# Patient Record
Sex: Male | Born: 1980 | Race: Black or African American | Hispanic: No | Marital: Single | Smoking: Current every day smoker
Health system: Southern US, Community
[De-identification: ages and names within clinical notes are randomized; demographics above are authoritative.]

## PROBLEM LIST (undated history)

## (undated) HISTORY — PX: HIP SURGERY: SHX245

## (undated) HISTORY — PX: HAND SURGERY: SHX662

---

## 2016-12-21 ENCOUNTER — Ambulatory Visit (INDEPENDENT_AMBULATORY_CARE_PROVIDER_SITE_OTHER): Payer: Medicaid Other | Admitting: Otolaryngology

## 2016-12-21 DIAGNOSIS — H9201 Otalgia, right ear: Secondary | ICD-10-CM | POA: Diagnosis not present

## 2016-12-21 DIAGNOSIS — H93293 Other abnormal auditory perceptions, bilateral: Secondary | ICD-10-CM | POA: Diagnosis not present

## 2017-02-08 ENCOUNTER — Ambulatory Visit (HOSPITAL_COMMUNITY)
Admission: RE | Admit: 2017-02-08 | Discharge: 2017-02-08 | Disposition: A | Discharge status: Home / Self Care | Payer: Medicaid Other | Source: Ambulatory Visit | Attending: Neurology | Admitting: Neurology

## 2017-02-08 ENCOUNTER — Other Ambulatory Visit: Payer: Self-pay | Admitting: Neurology

## 2017-02-08 DIAGNOSIS — M545 Low back pain: Secondary | ICD-10-CM

## 2017-02-08 DIAGNOSIS — M25551 Pain in right hip: Secondary | ICD-10-CM

## 2017-02-08 DIAGNOSIS — Z9889 Other specified postprocedural states: Secondary | ICD-10-CM | POA: Insufficient documentation

## 2017-12-27 ENCOUNTER — Encounter (HOSPITAL_COMMUNITY): Payer: Self-pay | Admitting: Emergency Medicine

## 2017-12-27 ENCOUNTER — Emergency Department (HOSPITAL_COMMUNITY)
Admission: EM | Admit: 2017-12-27 | Discharge: 2017-12-28 | Disposition: A | Discharge status: Home / Self Care | Payer: BLUE CROSS/BLUE SHIELD | Attending: Emergency Medicine | Admitting: Emergency Medicine

## 2017-12-27 ENCOUNTER — Other Ambulatory Visit: Payer: Self-pay

## 2017-12-27 DIAGNOSIS — F17200 Nicotine dependence, unspecified, uncomplicated: Secondary | ICD-10-CM | POA: Insufficient documentation

## 2017-12-27 DIAGNOSIS — H9202 Otalgia, left ear: Secondary | ICD-10-CM | POA: Diagnosis not present

## 2017-12-27 DIAGNOSIS — K029 Dental caries, unspecified: Secondary | ICD-10-CM | POA: Diagnosis not present

## 2017-12-27 NOTE — ED Triage Notes (Signed)
Pt c/o left ear pain x one week.

## 2017-12-28 MED ORDER — AMOXICILLIN 250 MG PO CAPS
500.0000 mg | ORAL_CAPSULE | Freq: Once | ORAL | Status: AC
  Administered 2017-12-28: 500 mg via ORAL
  Filled 2017-12-28: qty 2

## 2017-12-28 MED ORDER — PROMETHAZINE HCL 12.5 MG PO TABS
12.5000 mg | ORAL_TABLET | Freq: Once | ORAL | Status: AC
  Administered 2017-12-28: 12.5 mg via ORAL
  Filled 2017-12-28: qty 1

## 2017-12-28 MED ORDER — IBUPROFEN 600 MG PO TABS: 600.0000 mg | ORAL_TABLET | Freq: Four times a day (QID) | ORAL | 0 refills | Status: DC

## 2017-12-28 MED ORDER — TRAMADOL HCL 50 MG PO TABS
100.0000 mg | ORAL_TABLET | Freq: Once | ORAL | Status: AC
  Administered 2017-12-28: 100 mg via ORAL
  Filled 2017-12-28: qty 2

## 2017-12-28 MED ORDER — DEXAMETHASONE 4 MG PO TABS: 4.0000 mg | ORAL_TABLET | Freq: Two times a day (BID) | ORAL | 0 refills | Status: DC

## 2017-12-28 MED ORDER — IBUPROFEN 800 MG PO TABS
800.0000 mg | ORAL_TABLET | Freq: Once | ORAL | Status: AC
  Administered 2017-12-28: 800 mg via ORAL
  Filled 2017-12-28: qty 1

## 2017-12-28 MED ORDER — AMOXICILLIN 500 MG PO CAPS: 500.0000 mg | ORAL_CAPSULE | Freq: Three times a day (TID) | ORAL | 0 refills | Status: DC

## 2017-12-28 NOTE — ED Provider Notes (Signed)
[facility] EMERGENCY DEPARTMENT Provider Note   CSN: [id number] Arrival date & time: 12/27/17  2238     History   Chief Complaint Chief Complaint  Patient presents with  . Otalgia    HPI Christian Cook is a 37 y.o. male.  Patient is a 37 year old male who presents to the emergency department with a complaint of left ear pain for a week.  The patient states he has not had anything to injure his ear.  He has not put anything in his ear.  He has not been swimming a lot lately.  Is been no drainage from the left ear.  The patient states that he has has had some toothache recently, now has ear pain, as well as neck and face pain on the left side.  No fever, no chills, no nausea, and no vomiting reported.  No difficulty with swallowing or with breathing.  The history is provided by the patient.  Otalgia  Pertinent negatives include no abdominal pain, no neck pain and no cough.    History reviewed. No pertinent past medical history.  There are no active problems to display for this patient.   Past Surgical History:  Procedure Laterality Date  . HAND SURGERY    . HIP SURGERY          Home Medications    Prior to Admission medications   Not on File    Family History No family history on file.  Social History Social History   Tobacco Use  . Smoking status: Current Every Day Smoker  . Smokeless tobacco: Never Used  Substance Use Topics  . Alcohol use: Not Currently  . Drug use: Not Currently     Allergies   Patient has no allergy information on record.   Review of Systems Review of Systems  Constitutional: Negative for activity change.       All ROS Neg except as noted in HPI  HENT: Positive for dental problem and ear pain. Negative for nosebleeds.   Eyes: Negative for photophobia and discharge.  Respiratory: Negative for cough, shortness of breath and wheezing.   Cardiovascular: Negative for chest pain and palpitations.  Gastrointestinal: Negative for  abdominal pain and blood in stool.  Genitourinary: Negative for dysuria, frequency and hematuria.  Musculoskeletal: Negative for arthralgias, back pain and neck pain.  Skin: Negative.   Neurological: Negative for dizziness, seizures and speech difficulty.  Psychiatric/Behavioral: Negative for confusion and hallucinations.     Physical Exam Updated Vital Signs BP (!) 145/80 (BP Location: Right Arm)   Pulse 78   Temp 98.5 F (36.9 C) (Oral)   Resp 18   Ht 5\' 10"  (1.778 m)   Wt 93 kg   SpO2 100%   BMI 29.41 kg/m   Physical Exam  Constitutional: He is oriented to person, place, and time. He appears well-developed and well-nourished.  Non-toxic appearance.  HENT:  Head: Normocephalic.  Right Ear: Tympanic membrane and external ear normal.  Left Ear: Tympanic membrane and external ear normal.  Mild fluid behind the tympanic membrane on the left.  No bulging of the drum.  No erythema of the tympanic membrane.  The external auditory canal is clear.  There is no pre-or postauricular nodes appreciated.  The patient has a deep cavity of the left lower molar.  There is some swelling of the gum.  The airway is patent.  There is no swelling under the tongue.  Eyes: Pupils are equal, round, and reactive to light. EOM and  lids are normal.  Neck: Normal range of motion. Neck supple. Carotid bruit is not present.  Cardiovascular: Normal rate, regular rhythm, normal heart sounds, intact distal pulses and normal pulses.  Pulmonary/Chest: Breath sounds normal. No respiratory distress.  Abdominal: Soft. Bowel sounds are normal. There is no tenderness. There is no guarding.  Musculoskeletal: Normal range of motion.  Lymphadenopathy:       Head (right side): No submandibular adenopathy present.       Head (left side): No submandibular adenopathy present.    He has no cervical adenopathy.  Neurological: He is alert and oriented to person, place, and time. He has normal strength. No cranial nerve  deficit or sensory deficit.  Skin: Skin is warm and dry.  Psychiatric: He has a normal mood and affect. His speech is normal.  Nursing note and vitals reviewed.    ED Treatments / Results  Labs (all labs ordered are listed, but only abnormal results are displayed) Labs Reviewed - No data to display  EKG None  Radiology No results found.  Procedures Procedures (including critical care time)  Medications Ordered in ED Medications  traMADol (ULTRAM) tablet 100 mg (has no administration in time range)  promethazine (PHENERGAN) tablet 12.5 mg (has no administration in time range)  ibuprofen (ADVIL,MOTRIN) tablet 800 mg (has no administration in time range)  amoxicillin (AMOXIL) capsule 500 mg (has no administration in time range)     Initial Impression / Assessment and Plan / ED Course  I have reviewed the triage vital signs and the nursing notes.  Pertinent labs & imaging results that were available during my care of the patient were reviewed by me and considered in my medical decision making (see chart for details).      Final Clinical Impressions(s) / ED Diagnoses MDM  Vital signs within normal limits.  There is no foreign body in the left ear.  There is mild fluid behind the drum, otherwise no significant findings on exam.  There is noted deep cavity of the lower left molar.  I suspect that this is the cause of the patient's face, neck, and ear pain.  There is no evidence for Ludewig's angina.  There are no other emergent changes appreciated.  The patient will be treated with ibuprofen, Tylenol, and amoxicillin.  I strongly encouraged the patient to see a dentist as soon as possible.   Final diagnoses:  Dental caries  Otalgia of left ear    ED Discharge Orders         Ordered    amoxicillin (AMOXIL) 500 MG capsule  3 times daily     12/28/17 0049    dexamethasone (DECADRON) 4 MG tablet  2 times daily with meals     12/28/17 0049    ibuprofen (ADVIL,MOTRIN) 600  MG tablet  4 times daily     12/28/17 0049           Ivery Quale, PA-C 12/28/17 7829    Devoria Albe, MD 12/28/17 [phone]

## 2017-12-28 NOTE — Discharge Instructions (Addendum)
Your vital signs within normal limits.  I suspect that your ear problem is related to infected dental caries.  Is extremely important that you see a dentist as soon as possible next week.  Please use Amoxil 3 times daily.  Use ibuprofen 600 mg, and Tylenol 1000 mg with breakfast, lunch, dinner, and at bedtime.  Use Decadron 2 times daily with food.
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
## 2020-04-23 ENCOUNTER — Ambulatory Visit: Payer: Commercial Managed Care - PPO | Admitting: Nurse Practitioner

## 2020-06-04 ENCOUNTER — Ambulatory Visit (INDEPENDENT_AMBULATORY_CARE_PROVIDER_SITE_OTHER): Payer: Commercial Managed Care - PPO | Admitting: Nurse Practitioner

## 2020-06-04 ENCOUNTER — Encounter: Payer: Self-pay | Admitting: Nurse Practitioner

## 2020-06-04 DIAGNOSIS — I10 Essential (primary) hypertension: Secondary | ICD-10-CM | POA: Diagnosis not present

## 2020-06-04 MED ORDER — ATENOLOL 50 MG PO TABS: 50.0000 mg | ORAL_TABLET | Freq: Every day | ORAL | 0 refills | Status: DC

## 2020-06-04 NOTE — Progress Notes (Signed)
   Virtual Visit via telephone Note Due to COVID-19 pandemic this visit was conducted virtually. This visit type was conducted due to national recommendations for restrictions regarding the COVID-19 Pandemic (e.g. social distancing, sheltering in place) in an effort to limit this patient's exposure and mitigate transmission in our community. All issues noted in this document were discussed and addressed.  A physical exam was not performed with this format.  I connected with Christian Cook on 06/04/20 at  10 AM by telephone and verified that I am speaking with the correct person using two identifiers. Christian Cook is currently located at home and girlfriend is with patient during visit. The provider, Daryll Drown, NP is located in their office at time of visit.  I discussed the limitations, risks, security and privacy concerns of performing an evaluation and management service by telephone and the availability of in person appointments. I also discussed with the patient that there may be a patient responsible charge related to this service. The patient expressed understanding and agreed to proceed.   History and Present Illness:  HPI  Pt presents for follow up of hypertension. Patient was diagnosed a few years ago The patient is tolerating the medication well without side effects. Compliance with treatment has been good; including taking medication as directed , maintains a healthy diet and regular exercise regimen , and following up as directed. atenolol 50 mg tablet daily  Review of Systems  Constitutional: Negative.   HENT: Negative.   Eyes: Negative.   Respiratory: Negative.   Cardiovascular: Negative.   Skin: Negative.   Neurological: Negative.   All other systems reviewed and are negative.    Observations/Objective: Tele visit  Assessment and Plan: Essential hypertension Hypertension well managed on atenolol 50 mg tablet daily, no changes to current dose, follow up in 3  months.    Follow Up Instructions: Follow up in 3 months   I discussed the assessment and treatment plan with the patient. The patient was provided an opportunity to ask questions and all were answered. The patient agreed with the plan and demonstrated an understanding of the instructions.   The patient was advised to call back or seek an in-person evaluation if the symptoms worsen or if the condition fails to improve as anticipated.  The above assessment and management plan was discussed with the patient. The patient verbalized understanding of and has agreed to the management plan. Patient is aware to call the clinic if symptoms persist or worsen. Patient is aware when to return to the clinic for a follow-up visit. Patient educated on when it is appropriate to go to the emergency department.   Time call ended:  10:11 am  I provided 11 minutes of non-face-to-face time during this encounter.    Daryll Drown, NP

## 2020-06-04 NOTE — Assessment & Plan Note (Signed)
Hypertension well managed on atenolol 50 mg tablet daily, no changes to current dose, follow up in 3 months.

## 2022-02-15 ENCOUNTER — Ambulatory Visit: Payer: Commercial Managed Care - PPO | Admitting: Nurse Practitioner

## 2022-02-17 ENCOUNTER — Ambulatory Visit: Payer: Commercial Managed Care - PPO | Admitting: Nurse Practitioner

## 2022-02-24 ENCOUNTER — Encounter: Payer: Self-pay | Admitting: Nurse Practitioner

## 2022-02-24 ENCOUNTER — Ambulatory Visit: Payer: Commercial Managed Care - PPO | Admitting: Nurse Practitioner

## 2022-02-24 VITALS — Ht 70.0 in

## 2022-02-24 DIAGNOSIS — I10 Essential (primary) hypertension: Secondary | ICD-10-CM

## 2022-02-24 DIAGNOSIS — G8929 Other chronic pain: Secondary | ICD-10-CM | POA: Diagnosis not present

## 2022-02-24 DIAGNOSIS — M25551 Pain in right hip: Secondary | ICD-10-CM | POA: Diagnosis not present

## 2022-02-24 NOTE — Assessment & Plan Note (Signed)
Ongoing right hip pain.  Managed with tramadol 25 mg tablet by mouth daily.

## 2022-02-24 NOTE — Patient Instructions (Signed)
Hip Pain The hip is the joint between the upper legs and the lower pelvis. The bones, cartilage, tendons, and muscles of your hip joint support your body and allow you to move around. Hip pain can range from a minor ache to severe pain in one or both of your hips. The pain may be felt on the inside of the hip joint near the groin, or on the outside near the buttocks and upper thigh. You may also have swelling or stiffness in your hip area. Follow these instructions at home: Managing pain, stiffness, and swelling     If directed, put ice on the painful area. To do this: Put ice in a plastic bag. Place a towel between your skin and the bag. Leave the ice on for 20 minutes, 2-3 times a day. If directed, apply heat to the affected area as often as told by your health care provider. Use the heat source that your health care provider recommends, such as a moist heat pack or a heating pad. Place a towel between your skin and the heat source. Leave the heat on for 20-30 minutes. Remove the heat if your skin turns bright red. This is especially important if you are unable to feel pain, heat, or cold. You may have a greater risk of getting burned. Activity Do exercises as told by your health care provider. Avoid activities that cause pain. General instructions  Take over-the-counter and prescription medicines only as told by your health care provider. Keep a journal of your symptoms. Write down: How often you have hip pain. The location of your pain. What the pain feels like. What makes the pain worse. Sleep with a pillow between your legs on your most comfortable side. Keep all follow-up visits as told by your health care provider. This is important. Contact a health care provider if: You cannot put weight on your leg. Your pain or swelling continues or gets worse after one week. It gets harder to walk. You have a fever. Get help right away if: You fall. You have a sudden increase in pain  and swelling in your hip. Your hip is red or swollen or very tender to touch. Summary Hip pain can range from a minor ache to severe pain in one or both of your hips. The pain may be felt on the inside of the hip joint near the groin, or on the outside near the buttocks and upper thigh. Avoid activities that cause pain. Write down how often you have hip pain, the location of the pain, what makes it worse, and what it feels like. This information is not intended to replace advice given to you by your health care provider. Make sure you discuss any questions you have with your health care provider. Document Revised: 09/23/2018 Document Reviewed: 09/23/2018 Elsevier Patient Education  [place]. Hypertension, Adult Hypertension is another name for high blood pressure. High blood pressure forces your heart to work harder to pump blood. This can cause problems over time. There are two numbers in a blood pressure reading. There is a top number (systolic) over a bottom number (diastolic). It is best to have a blood pressure that is below 120/80. What are the causes? The cause of this condition is not known. Some other conditions can lead to high blood pressure. What increases the risk? Some lifestyle factors can make you more likely to develop high blood pressure: Smoking. Not getting enough exercise or physical activity. Being overweight. Having too much fat,  sugar, calories, or salt (sodium) in your diet. Drinking too much alcohol. Other risk factors include: Having any of these conditions: Heart disease. Diabetes. High cholesterol. Kidney disease. Obstructive sleep apnea. Having a family history of high blood pressure and high cholesterol. Age. The risk increases with age. Stress. What are the signs or symptoms? High blood pressure may not cause symptoms. Very high blood pressure (hypertensive crisis) may cause: Headache. Fast or uneven heartbeats (palpitations). Shortness of  breath. Nosebleed. Vomiting or feeling like you may vomit (nauseous). Changes in how you see. Very bad chest pain. Feeling dizzy. Seizures. How is this treated? This condition is treated by making healthy lifestyle changes, such as: Eating healthy foods. Exercising more. Drinking less alcohol. Your doctor may prescribe medicine if lifestyle changes do not help enough and if: Your top number is above 130. Your bottom number is above 80. Your personal target blood pressure may vary. Follow these instructions at home: Eating and drinking  If told, follow the DASH eating plan. To follow this plan: Fill one half of your plate at each meal with fruits and vegetables. Fill one fourth of your plate at each meal with whole grains. Whole grains include whole-wheat pasta, brown rice, and whole-grain bread. Eat or drink low-fat dairy products, such as skim milk or low-fat yogurt. Fill one fourth of your plate at each meal with low-fat (lean) proteins. Low-fat proteins include fish, chicken without skin, eggs, beans, and tofu. Avoid fatty meat, cured and processed meat, or chicken with skin. Avoid pre-made or processed food. Limit the amount of salt in your diet to less than 1,500 mg each day. Do not drink alcohol if: Your doctor tells you not to drink. You are pregnant, may be pregnant, or are planning to become pregnant. If you drink alcohol: Limit how much you have to: 0-1 drink a day for women. 0-2 drinks a day for men. Know how much alcohol is in your drink. In the [place] one drink equals one 12 oz bottle of beer (355 mL), one 5 oz glass of wine (148 mL), or one 1 oz glass of hard liquor (44 mL). Lifestyle  Work with your doctor to stay at a healthy weight or to lose weight. Ask your doctor what the best weight is for you. Get at least 30 minutes of exercise that causes your heart to beat faster (aerobic exercise) most days of the week. This may include walking, swimming, or  biking. Get at least 30 minutes of exercise that strengthens your muscles (resistance exercise) at least 3 days a week. This may include lifting weights or doing Pilates. Do not smoke or use any products that contain nicotine or tobacco. If you need help quitting, ask your doctor. Check your blood pressure at home as told by your doctor. Keep all follow-up visits. Medicines Take over-the-counter and prescription medicines only as told by your doctor. Follow directions carefully. Do not skip doses of blood pressure medicine. The medicine does not work as well if you skip doses. Skipping doses also puts you at risk for problems. Ask your doctor about side effects or reactions to medicines that you should watch for. Contact a doctor if: You think you are having a reaction to the medicine you are taking. You have headaches that keep coming back. You feel dizzy. You have swelling in your ankles. You have trouble with your vision. Get help right away if: You get a very bad headache. You start to feel mixed up (confused). You feel  weak or numb. You feel faint. You have very bad pain in your: Chest. Belly (abdomen). You vomit more than once. You have trouble breathing. These symptoms may be an emergency. Get help right away. Call 911. Do not wait to see if the symptoms will go away. Do not drive yourself to the hospital. Summary Hypertension is another name for high blood pressure. High blood pressure forces your heart to work harder to pump blood. For most people, a normal blood pressure is less than 120/80. Making healthy choices can help lower blood pressure. If your blood pressure does not get lower with healthy choices, you may need to take medicine. This information is not intended to replace advice given to you by your health care provider. Make sure you discuss any questions you have with your health care provider. Document Revised: 02/24/2021 Document Reviewed: 02/24/2021 Elsevier  Patient Education  2023 [employer].

## 2022-02-24 NOTE — Assessment & Plan Note (Addendum)
Hypertension well-controlled on current medication atenolol 50 mg tablet by mouth daily.  No changes necessary.  Follow-up 6 months with labs.

## 2022-02-24 NOTE — Progress Notes (Signed)
Acute Office Visit  Subjective:     Patient ID: Christian Cook, male    DOB: Oct 05, 1980, 41 y.o.   MRN: [id number]  Chief Complaint  Patient presents with   Pain Management    referral    HPI Patient is in today for Pain  He reports chronic right hip pain. was not an injury that may have caused the pain. The pain started a few years ago and is staying constant. The pain does not radiate . The pain is described as aching, soreness, stiffness, and throbbing, is 7/10 in intensity, occurring constantly. Symptoms are worse in the: morning, mid-day, afternoon  Aggravating factors: walking Relieving factors: none.  He has tried prescription pain relievers with moderate relief.    Pt presents for follow up of hypertension. Patient was diagnosed in 06/04/2020. The patient is tolerating the medication well without side effects. Compliance with treatment has been good; including taking atenolol 50 mg tablet by mouth daily.  As directed , maintains a healthy diet and regular exercise regimen , and following up as directed.     Review of Systems  Constitutional: Negative.  Negative for chills and fever.  HENT: Negative.    Respiratory: Negative.    Cardiovascular: Negative.   Gastrointestinal: Negative.   Genitourinary: Negative.   Musculoskeletal:  Positive for joint pain.  Skin:  Negative for itching and rash.  All other systems reviewed and are negative.       Objective:    Ht 5\' 10"  (1.778 m)   BMI 29.41 kg/m  BP Readings from Last 3 Encounters:  12/27/17 (!) 145/80   Wt Readings from Last 3 Encounters:  12/27/17 205 lb (93 kg)      Physical Exam Vitals and nursing note reviewed.  HENT:     Head: Normocephalic and atraumatic.     Right Ear: External ear normal.     Left Ear: External ear normal.     Nose: Nose normal.     Mouth/Throat:     Mouth: Mucous membranes are moist.     Pharynx: Oropharynx is clear.  Eyes:     Conjunctiva/sclera: Conjunctivae normal.   Cardiovascular:     Rate and Rhythm: Regular rhythm.     Pulses: Normal pulses.     Heart sounds: Normal heart sounds.  Pulmonary:     Effort: Pulmonary effort is normal.  Abdominal:     General: Bowel sounds are normal.  Musculoskeletal:     Right hip: Tenderness present. Decreased range of motion.  Skin:    General: Skin is warm.     Findings: No erythema or rash.  Neurological:     General: No focal deficit present.     Mental Status: He is alert and oriented to person, place, and time.  Psychiatric:        Behavior: Behavior normal.     No results found for any visits on 02/24/22.      Assessment & Plan:   Problem List Items Addressed This Visit       Cardiovascular and Mediastinum   Essential hypertension    Hypertension well-controlled on current medication atenolol 50 mg tablet by mouth daily.  No changes necessary.  Follow-up 6 months with labs.        Other   Chronic right hip pain - Primary    Ongoing right hip pain.  Managed with tramadol 25 mg tablet by mouth daily.      Relevant Orders   Ambulatory referral to  Pain Clinic    No orders of the defined types were placed in this encounter.   Return in about 6 months (around 08/26/2022) for Hypertension.  Daryll Drown, NP

## 2022-03-30 ENCOUNTER — Encounter: Payer: Self-pay | Admitting: Physical Medicine and Rehabilitation

## 2022-06-04 NOTE — Progress Notes (Deleted)
Christian Cook is a 41 y.o. year old male  who  has no past medical history on file.   They are presenting to PM&R clinic as a new patient for pain management evaluation. They were referred by your PCP Coralyn Mark, NP  for treatment of right hip pain.   Per her last note: "He reports chronic right hip pain. was not an injury that may have caused the pain. The pain started a few years ago and is staying constant. The pain does not radiate . The pain is described as aching, soreness, stiffness, and throbbing, is 7/10 in intensity, occurring constantly. Symptoms are worse in the: morning, mid-day, afternoon  Aggravating factors: walking Relieving factors: none.  He has tried prescription pain relievers with moderate relief. "  Source: *** Inciting incident: none Duration of pain: constant, *** Description of pain: *** Severity: On average *** /10. At worst *** /10. At best *** /10. Exacerbating factors: *** Remitting factors: *** Red flag symptoms: Patient denies saddle anesthesia, loss of bowel or bladder continence, new weakness, new numbness/tingling, or pain waking up at nighttime.  Medications tried: Topical medications ( *** effect) : *** Nsaids ( *** effect): Tried ibuprofen 600 mg QID in 2019.  Tylenol  ( *** effect): *** Opiates  ( *** effect): On tramadol 50 mg Q4H PRN (#240 tabs monthly) since at least 2021. Filled last 05/12/22 by Christella Hartigan, MD; has 2 scripts left per PDMP.  Gabapentin / Lyrica  ( *** effect): *** TCAs  ( *** effect): *** SNRIs  ( *** effect): *** Other  ( *** effect): ***  Other treatments: PT/OT  ( *** effect): *** Accupuncture/chiropractor/massage  ( *** effect): *** TENs unit ( *** effect): *** Injections ( *** effect): *** Surgery ( *** effect) : *** Other  ( *** effect): ***  Goals for pain control: ***

## 2022-06-05 ENCOUNTER — Encounter
Payer: Commercial Managed Care - PPO | Attending: Physical Medicine and Rehabilitation | Admitting: Physical Medicine and Rehabilitation

## 2022-08-11 ENCOUNTER — Telehealth: Payer: Self-pay | Admitting: Nurse Practitioner
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
## 2022-08-14 NOTE — Telephone Encounter (Signed)
Tried calling patient to see if he could come in one day this week to re-establish care with Donzetta Kohut, since she has had a few cancellations, so that he can discuss his medication issue with her so that she can determin whether or not to fill until he is seen at the pain management clinic. Patient did not answer and was unable to leave a message.

## 2022-08-14 NOTE — Telephone Encounter (Signed)
He will have to be seen in person by a provider and they will have to make the decision on whether or not to refill or not but he does need to be seen in person to discuss this by somebody.  We cannot promise that they will fill it or that they will not, they will have to look extensively at his history and make a decision from there with whoever he sees.

## 2022-08-23 ENCOUNTER — Encounter
Payer: Commercial Managed Care - PPO | Attending: Physical Medicine and Rehabilitation | Admitting: Physical Medicine and Rehabilitation

## 2022-09-01 ENCOUNTER — Ambulatory Visit: Payer: Commercial Managed Care - PPO | Admitting: Family Medicine

## 2022-09-01 ENCOUNTER — Encounter: Payer: Self-pay | Admitting: Family Medicine

## 2022-09-01 VITALS — BP 118/80 | HR 73 | Temp 98.3°F | Ht 70.0 in | Wt 220.0 lb

## 2022-09-01 DIAGNOSIS — F339 Major depressive disorder, recurrent, unspecified: Secondary | ICD-10-CM | POA: Insufficient documentation

## 2022-09-01 DIAGNOSIS — I10 Essential (primary) hypertension: Secondary | ICD-10-CM

## 2022-09-01 DIAGNOSIS — F410 Panic disorder [episodic paroxysmal anxiety] without agoraphobia: Secondary | ICD-10-CM | POA: Diagnosis not present

## 2022-09-01 DIAGNOSIS — G8928 Other chronic postprocedural pain: Secondary | ICD-10-CM

## 2022-09-01 DIAGNOSIS — Z136 Encounter for screening for cardiovascular disorders: Secondary | ICD-10-CM

## 2022-09-01 DIAGNOSIS — E559 Vitamin D deficiency, unspecified: Secondary | ICD-10-CM

## 2022-09-01 MED ORDER — ATENOLOL 50 MG PO TABS: 50.0000 mg | ORAL_TABLET | Freq: Every day | ORAL | 0 refills | Status: DC

## 2022-09-01 MED ORDER — HYDROXYZINE HCL 10 MG PO TABS: ORAL_TABLET | ORAL | 0 refills | Status: DC

## 2022-09-01 MED ORDER — SERTRALINE HCL 25 MG PO TABS: 25.0000 mg | ORAL_TABLET | Freq: Every day | ORAL | 0 refills | Status: DC

## 2022-09-01 MED ORDER — TRAMADOL HCL 50 MG PO TABS: ORAL_TABLET | ORAL | 0 refills | Status: DC

## 2022-09-01 NOTE — Progress Notes (Signed)
New Patient Office Visit  Subjective   Patient ID: Christian Cook, male    DOB: [birth year]  Age: 42 y.o. MRN: [id number]  CC:  Chief Complaint  Patient presents with   Establish Care    Hypertension f/u Med refill Pain mgmt referral, Dr. Gerilyn Pilgrim retired.   HPI Christian Cook presents to establish care Pain in right hip from an accident.  States that because of the right hip pain has to compensate and it causes pain in his left knee.  States that he was previously treated in [place] with hydrocodone and he did not like it because it made him sleepy and itchy. He has since been on Tramadol (2006). Previously treated by Dr. Gerilyn Pilgrim who retired.  Last filled tramadol February 20th.     Depression and Axneity  Takes on Atarax daily for sleep.  Has panic attacks often while he is trying to go to sleep. Atarax helps. Reports that he has a fear of death. Denies SI. Has protective factors in sons. He has not ever taken an SSRI that he knows of.    Outpatient Encounter Medications as of 09/01/2022  Medication Sig   atenolol (TENORMIN) 50 MG tablet Take 1 tablet (50 mg total) by mouth daily.   hydrOXYzine (ATARAX) 10 MG tablet    ibuprofen (ADVIL,MOTRIN) 600 MG tablet Take 1 tablet (600 mg total) by mouth 4 (four) times daily.   traMADol (ULTRAM) 50 MG tablet    No facility-administered encounter medications on file as of 09/01/2022.    History reviewed. No pertinent past medical history.  Past Surgical History:  Procedure Laterality Date   HAND SURGERY     HIP SURGERY      History reviewed. No pertinent family history.  Social History   Socioeconomic History   Marital status: Single    Spouse name: Not on file   Number of children: Not on file   Years of education: Not on file   Highest education level: Not on file  Occupational History   Not on file  Tobacco Use   Smoking status: Every Day   Smokeless tobacco: Never  Substance and Sexual Activity   Alcohol use:  Not Currently   Drug use: Not Currently   Sexual activity: Not on file  Other Topics Concern   Not on file  Social History Narrative   Not on file   Social Determinants of Health   Financial Resource Strain: Not on file  Food Insecurity: Not on file  Transportation Needs: Not on file  Physical Activity: Not on file  Stress: Not on file  Social Connections: Not on file  Intimate Partner Violence: Not on file    ROS As per HPI  Objective   BP 118/80   Pulse 73   Temp 98.3 F (36.8 C)   Ht 5\' 10"  (1.778 m)   Wt 220 lb (99.8 kg)   SpO2 99%   BMI 31.57 kg/m   Physical Exam Constitutional:      General: He is not in acute distress.    Appearance: Normal appearance. He is not ill-appearing, toxic-appearing or diaphoretic.  Cardiovascular:     Rate and Rhythm: Normal rate.     Pulses: Normal pulses.     Heart sounds: Normal heart sounds. No murmur heard.    No gallop.  Pulmonary:     Effort: Pulmonary effort is normal. No respiratory distress.     Breath sounds: Normal breath sounds. No stridor. No wheezing, rhonchi or rales.  Skin:    General: Skin is warm.     Capillary Refill: Capillary refill takes less than 2 seconds.  Neurological:     General: No focal deficit present.     Mental Status: He is alert and oriented to person, place, and time. Mental status is at baseline.     Motor: No weakness.  Psychiatric:        Mood and Affect: Mood normal.        Behavior: Behavior normal.        Thought Content: Thought content normal.        Judgment: Judgment normal.    Assessment & Plan:  1. Other chronic postprocedural pain Referral placed as below for patient to follow up with pain management. Explained to patient that this is a one time bridge of pain medication until he can be seen by the pain clinic.  - Ambulatory referral to Pain Clinic - traMADol (ULTRAM) 50 MG tablet; Take one - two  tablets every 6 hours as needed for pain  Dispense: 240 tablet; Refill:  0  2. Primary hypertension Patient well controlled on medication. Discussed with patient that his current regimen is that the most up to date. Instructed for patient to check his BP at home daily in the am with feet on the floor after sitting for 5 minutes. Will have patient take medication once daily for one month and re-evaluate. Patient was previously taking atenolol twice daily. Labs as below. Will communicate results to patient once available.  - atenolol (TENORMIN) 50 MG tablet; Take 1 tablet (50 mg total) by mouth daily.  Dispense: 60 tablet; Refill: 0 - CBC with Differential/Platelet - CMP14+EGFR  3. Anxiety attack Medication refilled as below.  Will start a daily medication as below. BBW discussed with patient with instructions to follow up in one month.  Denies SI/HI Labs as below. Will communicate results to patient once available.  - hydrOXYzine (ATARAX) 10 MG tablet; Take as needed for anxiety  Dispense: 30 tablet; Refill: 0 - sertraline (ZOLOFT) 25 MG tablet; Take 1 tablet (25 mg total) by mouth daily.  Dispense: 30 tablet; Refill: 0 - TSH - Vitamin B12 - VITAMIN D 25 Hydroxy (Vit-D Deficiency, Fractures)  4. Depression, recurrent Pt screened positive for depression today with PHQ-9. Pt offered nonpharmacologic therapy. Pt declined initiating treatment at this time. Safety contract established today with patient in clinic. Denies intent to harm themselves or others.  Will start pharm therapy as below.  - sertraline (ZOLOFT) 25 MG tablet; Take 1 tablet (25 mg total) by mouth daily.  Dispense: 30 tablet; Refill: 0  5. Encounter for screening for cardiovascular disorders Labs as below. Will communicate results to patient once available.  Fasting since 2 am, labs collected ~2 pm  - Lipid panel  The above assessment and management plan was discussed with the patient. The patient verbalized understanding of and has agreed to the management plan using shared-decision making.  Patient is aware to call the clinic if they develop any new symptoms or if symptoms fail to improve or worsen. Patient is aware when to return to the clinic for a follow-up visit. Patient educated on when it is appropriate to go to the emergency department.   Neale Burly, DNP-FNP [facility] [address] [place], [place] [address] [phone]

## 2022-09-02 LAB — CMP14+EGFR
ALT: 16 IU/L (ref 0–44)
AST: 18 IU/L (ref 0–40)
Albumin/Globulin Ratio: 1.7 (ref 1.2–2.2)
Albumin: 4.6 g/dL (ref 4.1–5.1)
Alkaline Phosphatase: 113 IU/L (ref 44–121)
BUN/Creatinine Ratio: 13 (ref 9–20)
BUN: 12 mg/dL (ref 6–24)
Bilirubin Total: 0.4 mg/dL (ref 0.0–1.2)
CO2: 23 mmol/L (ref 20–29)
Calcium: 9.7 mg/dL (ref 8.7–10.2)
Chloride: 99 mmol/L (ref 96–106)
Creatinine, Ser: 0.94 mg/dL (ref 0.76–1.27)
Globulin, Total: 2.7 g/dL (ref 1.5–4.5)
Glucose: 93 mg/dL (ref 70–99)
Potassium: 4.1 mmol/L (ref 3.5–5.2)
Sodium: 137 mmol/L (ref 134–144)
Total Protein: 7.3 g/dL (ref 6.0–8.5)
eGFR: 104 mL/min/{1.73_m2} (ref >59)

## 2022-09-02 LAB — CBC WITH DIFFERENTIAL/PLATELET
Basophils Absolute: 0.1 10*3/uL (ref 0.0–0.2)
Basos: 0 %
EOS (ABSOLUTE): 0.2 10*3/uL (ref 0.0–0.4)
Eos: 2 %
Hematocrit: 41.6 % (ref 37.5–51.0)
Hemoglobin: 13.5 g/dL (ref 13.0–17.7)
Immature Grans (Abs): 0 10*3/uL (ref 0.0–0.1)
Immature Granulocytes: 0 %
Lymphocytes Absolute: 4.7 10*3/uL — ABNORMAL HIGH (ref 0.7–3.1)
Lymphs: 42 %
MCH: 26.4 pg — ABNORMAL LOW (ref 26.6–33.0)
MCHC: 32.5 g/dL (ref 31.5–35.7)
MCV: 81 fL (ref 79–97)
Monocytes Absolute: 0.8 10*3/uL (ref 0.1–0.9)
Monocytes: 7 %
Neutrophils Absolute: 5.6 10*3/uL (ref 1.4–7.0)
Neutrophils: 49 %
Platelets: 338 10*3/uL (ref 150–450)
RBC: 5.11 x10E6/uL (ref 4.14–5.80)
RDW: 14.8 % (ref 11.6–15.4)
WBC: 11.4 10*3/uL — ABNORMAL HIGH (ref 3.4–10.8)

## 2022-09-02 LAB — LIPID PANEL
Chol/HDL Ratio: 6.2 ratio — ABNORMAL HIGH (ref 0.0–5.0)
Cholesterol, Total: 222 mg/dL — ABNORMAL HIGH (ref 100–199)
HDL: 36 mg/dL — ABNORMAL LOW (ref >39)
LDL Chol Calc (NIH): 139 mg/dL — ABNORMAL HIGH (ref 0–99)
Triglycerides: 261 mg/dL — ABNORMAL HIGH (ref 0–149)
VLDL Cholesterol Cal: 47 mg/dL — ABNORMAL HIGH (ref 5–40)

## 2022-09-02 LAB — VITAMIN B12: Vitamin B-12: 939 pg/mL (ref 232–1245)

## 2022-09-02 LAB — VITAMIN D 25 HYDROXY (VIT D DEFICIENCY, FRACTURES): Vit D, 25-Hydroxy: 12.5 ng/mL — ABNORMAL LOW (ref 30.0–100.0)

## 2022-09-02 LAB — TSH: TSH: 1.44 u[IU]/mL (ref 0.450–4.500)

## 2022-09-04 MED ORDER — VITAMIN D (ERGOCALCIFEROL) 1.25 MG (50000 UNIT) PO CAPS: 50000.0000 [IU] | ORAL_CAPSULE | ORAL | 0 refills | Status: AC

## 2022-09-04 NOTE — Addendum Note (Signed)
Addended by: Neale Burly on: 09/04/2022 02:28 PM   Modules accepted: Orders

## 2022-09-26 ENCOUNTER — Telehealth: Payer: Self-pay | Admitting: Family Medicine

## 2022-09-26 NOTE — Telephone Encounter (Signed)
Patient started taking sertraline (ZOLOFT) 25 MG tablet on 09/01/22 and said that he has been having crazy thoughts since. Stated that he was taking hydrOXYzine (ATARAX) 10 MG before and did not feel this way. Please call back and advise.

## 2022-09-26 NOTE — Telephone Encounter (Signed)
Patient aware and verbalizes understanding.  States he is not having any suicidal thoughts.  He is anxiety is just worse.  Will follow up next week.

## 2022-10-03 ENCOUNTER — Ambulatory Visit: Payer: Commercial Managed Care - PPO | Admitting: Family Medicine

## 2022-10-03 ENCOUNTER — Encounter: Payer: Self-pay | Admitting: Family Medicine

## 2022-10-03 VITALS — BP 117/72 | HR 57 | Temp 98.0°F | Ht 70.0 in | Wt 213.0 lb

## 2022-10-03 DIAGNOSIS — F339 Major depressive disorder, recurrent, unspecified: Secondary | ICD-10-CM

## 2022-10-03 DIAGNOSIS — F418 Other specified anxiety disorders: Secondary | ICD-10-CM

## 2022-10-03 DIAGNOSIS — F410 Panic disorder [episodic paroxysmal anxiety] without agoraphobia: Secondary | ICD-10-CM | POA: Diagnosis not present

## 2022-10-03 DIAGNOSIS — E785 Hyperlipidemia, unspecified: Secondary | ICD-10-CM | POA: Insufficient documentation

## 2022-10-03 DIAGNOSIS — E782 Mixed hyperlipidemia: Secondary | ICD-10-CM

## 2022-10-03 DIAGNOSIS — E559 Vitamin D deficiency, unspecified: Secondary | ICD-10-CM

## 2022-10-03 DIAGNOSIS — B351 Tinea unguium: Secondary | ICD-10-CM

## 2022-10-03 MED ORDER — ATORVASTATIN CALCIUM 20 MG PO TABS: 20.0000 mg | ORAL_TABLET | Freq: Every day | ORAL | 0 refills | Status: DC

## 2022-10-03 MED ORDER — TERBINAFINE HCL 250 MG PO TABS: 250.0000 mg | ORAL_TABLET | Freq: Every day | ORAL | 2 refills | Status: AC

## 2022-10-03 MED ORDER — HYDROXYZINE HCL 10 MG PO TABS: ORAL_TABLET | ORAL | 0 refills | Status: DC

## 2022-10-03 NOTE — Progress Notes (Signed)
Acute Office Visit  Subjective:  Patient ID: Christian Cook, male    DOB: 26-Aug-1980, 42 y.o.   MRN: [id number]  Chief Complaint  Patient presents with   Medical Management of Chronic Issues   HPI Patient is in today for follow up zoloft start. Stopped taking zoloft one week ago after trying it for 2.5 weeks. States that it made him very moody. Reports that he was "having the worst thoughts" he thinks about dying by car crash, drowning, heart attack, etc. States that he feels that best when using the atarax before bed. He helps him sleep through the night. Being out of atarax, he has not slept well. Interrupted sleep and struggles to fall asleep.   HLD  Reports that he used to be on Lipitor when he was in [facility]. Tolerated well.   Toe nail/foot itching States that he dropped a palate on his feet 3 years ago and recently dropped another one on his foot at work. Reports that his foot itches and he has been trying cream to help with the itching.   ROS As per HPI  Objective:  BP 117/72   Pulse (!) 57   Temp 98 F (36.7 C)   Ht 5\' 10"  (1.778 m)   Wt 213 lb (96.6 kg)   SpO2 98%   BMI 30.56 kg/m   Physical Exam Constitutional:      General: He is not in acute distress.    Appearance: Normal appearance. He is not ill-appearing, toxic-appearing or diaphoretic.  Cardiovascular:     Rate and Rhythm: Normal rate.     Pulses: Normal pulses.     Heart sounds: Normal heart sounds. No murmur heard.    No gallop.  Pulmonary:     Effort: Pulmonary effort is normal. No respiratory distress.     Breath sounds: Normal breath sounds. No stridor. No wheezing, rhonchi or rales.  Feet:     Right foot:     Skin integrity: Dry skin present.     Toenail Condition: Right toenails are abnormally thick and long. Fungal disease present.    Comments: Ecchymosis with purpling, black, under right great toenail  Skin:    General: Skin is warm.     Capillary Refill: Capillary refill takes less than 2  seconds.  Neurological:     General: No focal deficit present.     Mental Status: He is alert and oriented to person, place, and time. Mental status is at baseline.     Motor: No weakness.  Psychiatric:        Mood and Affect: Mood normal.        Behavior: Behavior normal.        Thought Content: Thought content normal.        Judgment: Judgment normal.    Assessment & Plan:  1. Anxiety associated with depression 2. Depression, recurrent (HCC) 3. Anxiety attack Will refill as below. Denies SI. Will continue to assess need for daily antidepressant/antianxiety.  - hydrOXYzine (ATARAX) 10 MG tablet; Take as needed for anxiety.  Dispense: 90 tablet; Refill: 0  4. Mixed hyperlipidemia Will start Lipitor as below. Patient states that he was previously on lipitor when he was living in [place]. States that he would be more compliant with Lipitor than with supplements such as fish oil and red yeast rice.  - atorvastatin (LIPITOR) 20 MG tablet; Take 1 tablet (20 mg total) by mouth daily.  Dispense: 90 tablet; Refill: 0  5. Vitamin D deficiency Has  not yet picked up supplementation. Patient to notify provider if no longer at pharmacy for pick up.   6. Onychomycosis Will start terbinafine as below. Discussed with patient monitoring LFTs. Previous CMP within normal limits. Will repeat labs at follow up. - terbinafine (LAMISIL) 250 MG tablet; Take 1 tablet (250 mg total) by mouth daily for 84 doses.  Dispense: 30 tablet; Refill: 2 - CMP14+EGFR; Future  The above assessment and management plan was discussed with the patient. The patient verbalized understanding of and has agreed to the management plan using shared-decision making. Patient is aware to call the clinic if they develop any new symptoms or if symptoms fail to improve or worsen. Patient is aware when to return to the clinic for a follow-up visit. Patient educated on when it is appropriate to go to the emergency department.   Return in  about 8 weeks (around 11/28/2022) for Chronic Condition Follow up.  Neale Burly, DNP-FNP [facility] [address] [place], [place] [address] [phone]

## 2022-10-04 LAB — LAB REPORT - SCANNED: EGFR: 115

## 2022-11-16 ENCOUNTER — Ambulatory Visit: Payer: Commercial Managed Care - PPO | Admitting: Family Medicine

## 2022-11-27 ENCOUNTER — Other Ambulatory Visit: Payer: Self-pay | Admitting: Family Medicine

## 2022-11-27 DIAGNOSIS — E782 Mixed hyperlipidemia: Secondary | ICD-10-CM

## 2023-05-04 ENCOUNTER — Encounter: Payer: Self-pay | Admitting: Family Medicine

## 2023-05-04 ENCOUNTER — Ambulatory Visit: Payer: Commercial Managed Care - PPO | Admitting: Family Medicine

## 2023-05-04 VITALS — BP 138/84 | Temp 98.2°F | Ht 70.0 in | Wt 213.0 lb

## 2023-05-04 DIAGNOSIS — G4726 Circadian rhythm sleep disorder, shift work type: Secondary | ICD-10-CM | POA: Diagnosis not present

## 2023-05-04 DIAGNOSIS — F339 Major depressive disorder, recurrent, unspecified: Secondary | ICD-10-CM | POA: Diagnosis not present

## 2023-05-04 DIAGNOSIS — I10 Essential (primary) hypertension: Secondary | ICD-10-CM

## 2023-05-04 DIAGNOSIS — F418 Other specified anxiety disorders: Secondary | ICD-10-CM | POA: Insufficient documentation

## 2023-05-04 DIAGNOSIS — F410 Panic disorder [episodic paroxysmal anxiety] without agoraphobia: Secondary | ICD-10-CM

## 2023-05-04 DIAGNOSIS — E559 Vitamin D deficiency, unspecified: Secondary | ICD-10-CM

## 2023-05-04 DIAGNOSIS — E782 Mixed hyperlipidemia: Secondary | ICD-10-CM

## 2023-05-04 DIAGNOSIS — Z8042 Family history of malignant neoplasm of prostate: Secondary | ICD-10-CM

## 2023-05-04 LAB — LIPID PANEL

## 2023-05-04 MED ORDER — ATORVASTATIN CALCIUM 20 MG PO TABS: 20.0000 mg | ORAL_TABLET | Freq: Every day | ORAL | 0 refills | Status: DC

## 2023-05-04 MED ORDER — TRAZODONE HCL 50 MG PO TABS: 50.0000 mg | ORAL_TABLET | Freq: Every evening | ORAL | 0 refills | Status: DC | PRN

## 2023-05-04 MED ORDER — ATENOLOL 50 MG PO TABS: 50.0000 mg | ORAL_TABLET | Freq: Every day | ORAL | 1 refills | Status: DC

## 2023-05-04 MED ORDER — HYDROXYZINE PAMOATE 25 MG PO CAPS: 25.0000 mg | ORAL_CAPSULE | Freq: Every day | ORAL | 0 refills | Status: DC | PRN

## 2023-05-04 NOTE — Progress Notes (Signed)
Subjective:  Patient ID: Christian Cook, male    DOB: 04-14-1981, 42 y.o.   MRN: [id number]  Patient Care Team: Arrie Senate, FNP as PCP - General (Family Medicine)   Chief Complaint:  Medical Management of Chronic Issues   HPI: Christian Cook is a 42 y.o. male presenting on 05/04/2023 for Medical Management of Chronic Issues  Hypertension Has BP monitor at home Yes BP at home average 130/84 - states that he has been out of his medication for about a month. Reports that he takes it daily in the mornings, as he is about to fall asleep because that is when he is anxious and is having symptoms of anxiety such as palpitations and racing heart.  ROS Denies , fatigue, peripheral edema, changes to vision, chest pain, headaches, sweats, SOB, PND, orthopnea Meds atenolol  CAD risks hypertension  Anxiety  States that he is still having trouble sleeping. 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 He tries to use melatonin. States that he has racing thoughts and anxiety. He is waking up several times a week with "crazy" thoughts and takes about 20-30 minutes to fall back asleep. Reports that he continues to have some palpiations. He typically takes two hydroxyzine capsules to help him sleep. He wonders if it is no longer helping him as he has been taking it for a long time. Denies SI. Does not wish to start counseling. He is still having about one panic attack a month while at work. States that during the most recent one he was able to calm down by walking outside into the cold air.   Lipid/Cholesterol, Follow-up  Last lipid panel Other pertinent labs  Lab Results  Component Value Date   CHOL 222 (H) 09/01/2022   HDL 36 (L) 09/01/2022   LDLCALC 139 (H) 09/01/2022   TRIG 261 (H) 09/01/2022   CHOLHDL 6.2 (H) 09/01/2022   Lab Results  Component Value Date   ALT 16 09/01/2022   AST 18 09/01/2022   PLT 338 09/01/2022   TSH 1.440 09/01/2022     He was last seen for  this 8 months ago.  Management since that visit includes lipitor.  He reports excellent compliance with treatment. He is not having side effects.   Symptoms: No chest pain No chest pressure/discomfort  No dyspnea No lower extremity edema  No numbness or tingling of extremity No orthopnea  Yes palpitations - attributed to anxiety  No paroxysmal nocturnal dyspnea  No speech difficulty No syncope   The 10-year ASCVD risk score (Arnett DK, et al., 2019) is: 12.7%  ---------------------------------------------------------------------------------------------------   Vitamin D Not currently taking supplement.  Not having symptoms. Denies fractures   Of note, his Father was diagnosed with Prostate Cancer at 67/68 and he is concerned and would like to be tested. Denies any symptoms.    Relevant past medical, surgical, family, and social history reviewed and updated as indicated.  Allergies and medications reviewed and updated. Data reviewed: Chart in Epic.   History reviewed. No pertinent past medical history.  Past Surgical History:  Procedure Laterality Date   HAND SURGERY     HIP SURGERY      Social History   Socioeconomic History   Marital status: Single    Spouse name: Not on file   Number of children: Not on file   Years of education: Not on file   Highest education level: Not on file  Occupational History   Not on  file  Tobacco Use   Smoking status: Every Day   Smokeless tobacco: Never  Substance and Sexual Activity   Alcohol use: Not Currently   Drug use: Not Currently   Sexual activity: Not on file  Other Topics Concern   Not on file  Social History Narrative   Not on file   Social Drivers of Health   Financial Resource Strain: Not on file  Food Insecurity: Not on file  Transportation Needs: Not on file  Physical Activity: Not on file  Stress: Not on file  Social Connections: Not on file  Intimate Partner Violence: Not on file    Outpatient  Encounter Medications as of 05/04/2023  Medication Sig   atenolol (TENORMIN) 50 MG tablet Take 1 tablet (50 mg total) by mouth daily.   atorvastatin (LIPITOR) 20 MG tablet TAKE ONE (1) TABLET BY MOUTH EVERY DAY   hydrOXYzine (ATARAX) 10 MG tablet Take as needed for anxiety.   ibuprofen (ADVIL,MOTRIN) 600 MG tablet Take 1 tablet (600 mg total) by mouth 4 (four) times daily.   [DISCONTINUED] traMADol (ULTRAM) 50 MG tablet Take one - two  tablets every 6 hours as needed for pain (Patient not taking: Reported on 05/04/2023)   No facility-administered encounter medications on file as of 05/04/2023.    No Known Allergies  Review of Systems As per HPI  Objective:  BP 138/84   Temp 98.2 F (36.8 C)   Ht 5\' 10"  (1.778 m)   Wt 213 lb (96.6 kg)   SpO2 99%   BMI 30.56 kg/m    Wt Readings from Last 3 Encounters:  05/04/23 213 lb (96.6 kg)  10/03/22 213 lb (96.6 kg)  09/01/22 220 lb (99.8 kg)   Physical Exam Constitutional:      General: He is awake. He is not in acute distress.    Appearance: Normal appearance. He is well-developed and well-groomed. He is not ill-appearing, toxic-appearing or diaphoretic.  Cardiovascular:     Rate and Rhythm: Normal rate and regular rhythm.     Pulses: Normal pulses.          Radial pulses are 2+ on the right side and 2+ on the left side.       Posterior tibial pulses are 2+ on the right side and 2+ on the left side.     Heart sounds: Normal heart sounds. No murmur heard.    No gallop.  Pulmonary:     Effort: Pulmonary effort is normal. No respiratory distress.     Breath sounds: Normal breath sounds. No stridor. No wheezing, rhonchi or rales.  Musculoskeletal:     Cervical back: Full passive range of motion without pain and neck supple.     Right lower leg: No edema.     Left lower leg: No edema.  Skin:    General: Skin is warm.     Capillary Refill: Capillary refill takes less than 2 seconds.  Neurological:     General: No focal deficit  present.     Mental Status: He is alert, oriented to person, place, and time and easily aroused. Mental status is at baseline.     GCS: GCS eye subscore is 4. GCS verbal subscore is 5. GCS motor subscore is 6.     Motor: No weakness.  Psychiatric:        Attention and Perception: Attention and perception normal.        Mood and Affect: Mood and affect normal.  Speech: Speech normal.        Behavior: Behavior normal. Behavior is cooperative.        Thought Content: Thought content normal. Thought content does not include homicidal or suicidal ideation. Thought content does not include homicidal or suicidal plan.        Cognition and Memory: Cognition and memory normal.        Judgment: Judgment normal.     Results for orders placed or performed in visit on 10/18/22  Lab report - scanned   Collection Time: 10/04/22 12:00 AM  Result Value Ref Range   EGFR 115.0        05/04/2023    8:36 AM 10/03/2022    8:09 AM 09/01/2022    1:52 PM 06/04/2020   10:14 AM  Depression screen PHQ 2/9  Decreased Interest 2 1 2  0  Down, Depressed, Hopeless 2 1 0 0  PHQ - 2 Score 4 2 2  0  Altered sleeping  1 2   Tired, decreased energy 2 1 3    Change in appetite 1 2 1    Feeling bad or failure about yourself  0 0 0   Trouble concentrating 0 0 0   Moving slowly or fidgety/restless 0 0 3   Suicidal thoughts 0 2 0   PHQ-9 Score  8 11   Difficult doing work/chores Not difficult at all Somewhat difficult Very difficult        05/04/2023    8:36 AM 10/03/2022    8:10 AM 09/01/2022    1:53 PM  GAD 7 : Generalized Anxiety Score  Nervous, Anxious, on Edge 2 1 3   Control/stop worrying 1 2 0  Worry too much - different things 1 2 0  Trouble relaxing 1 1 3   Restless 1 1 2   Easily annoyed or irritable 2 2 2   Afraid - awful might happen 1 2 0  Total GAD 7 Score 9 11 10   Anxiety Difficulty Not difficult at all Somewhat difficult Very difficult    Pertinent labs & imaging results that were  available during my care of the patient were reviewed by me and considered in my medical decision making.  Assessment & Plan:  Meron was seen today for medical management of chronic issues.  Diagnoses and all orders for this visit:  1. Primary hypertension (Primary) Not at goal. Requested that patient monitor BP at home and bring in measurements. Discussed with patient changing regimen as he is not on first line therapy for hypertension. Shared decision making utilized to stay on current medication as it is treating his symptoms of anxiety as well by helping with his heart racing and palpitations. Labs as below. Will communicate results to patient once available. Will await results to determine next steps.  - atenolol (TENORMIN) 50 MG tablet; Take 1 tablet (50 mg total) by mouth daily.  Dispense: 90 tablet; Refill: 1 - CBC with Differential/Platelet - CMP14+EGFR  2. Sleep disorder, circadian, shift work type Will start medication as below. Discussed with patient not using both hydroxyzine and trazodone for sleep. He will use hydroxyzine sparingly as needed for panic attacks while at work. He will continue to use sleep hygiene practices.  - traZODone (DESYREL) 50 MG tablet; Take 1 tablet (50 mg total) by mouth at bedtime as needed for sleep.  Dispense: 90 tablet; Refill: 0  3. Anxiety associated with depression Discussed with patient not using both hydroxyzine and trazodone for sleep. He will use hydroxyzine sparingly as needed for panic  attacks while at work. He will continue to use sleep hygiene practices. Depression very well controlled. Denies SI. Declines counseling. Declines daily antianxiety pharmacologic aid.  - hydrOXYzine (VISTARIL) 25 MG capsule; Take 1 capsule (25 mg total) by mouth daily as needed for anxiety.  Dispense: 60 capsule; Refill: 0  4. Depression, recurrent (HCC) As above.   5. Anxiety attack As above.  - hydrOXYzine (VISTARIL) 25 MG capsule; Take 1 capsule (25 mg  total) by mouth daily as needed for anxiety.  Dispense: 60 capsule; Refill: 0  6. Mixed hyperlipidemia Refill provided. Labs as below. Will communicate results to patient once available. Will await results to determine next steps. Fasting  - atorvastatin (LIPITOR) 20 MG tablet; Take 1 tablet (20 mg total) by mouth daily.  Dispense: 90 tablet; Refill: 0 - Lipid panel  7. Family history of prostate cancer Labs as below. Will communicate results to patient once available. Will await results to determine next steps.  - PSA  8. Vitamin D deficiency Labs as below. Will communicate results to patient once available. Will await results to determine next steps.  - VITAMIN D 25 Hydroxy (Vit-D Deficiency, Fractures)  Continue all other maintenance medications.  Follow up plan: Return in about 2 months (around 07/05/2023) for Chronic Condition Follow up.  Continue healthy lifestyle choices, including diet (rich in fruits, vegetables, and lean proteins, and low in salt and simple carbohydrates) and exercise (at least 30 minutes of moderate physical activity daily).  Written and verbal instructions provided   The above assessment and management plan was discussed with the patient. The patient verbalized understanding of and has agreed to the management plan. Patient is aware to call the clinic if they develop any new symptoms or if symptoms persist or worsen. Patient is aware when to return to the clinic for a follow-up visit. Patient educated on when it is appropriate to go to the emergency department.   Neale Burly, DNP-FNP [facility] [address] [place], [place] [address] [phone]

## 2023-05-05 LAB — COMPREHENSIVE METABOLIC PANEL
ALT: 15 IU/L (ref 0–44)
AST: 18 IU/L (ref 0–40)
Albumin: 4.1 g/dL (ref 4.1–5.1)
Alkaline Phosphatase: 96 IU/L (ref 44–121)
BUN/Creatinine Ratio: 14 (ref 9–20)
BUN: 12 mg/dL (ref 6–24)
Bilirubin Total: 0.3 mg/dL (ref 0.0–1.2)
CO2: 25 mmol/L (ref 20–29)
Calcium: 9.3 mg/dL (ref 8.7–10.2)
Chloride: 102 mmol/L (ref 96–106)
Creatinine, Ser: 0.83 mg/dL (ref 0.76–1.27)
Globulin, Total: 2.7 g/dL (ref 1.5–4.5)
Glucose: 86 mg/dL (ref 70–99)
Potassium: 4.8 mmol/L (ref 3.5–5.2)
Sodium: 141 mmol/L (ref 134–144)
Total Protein: 6.8 g/dL (ref 6.0–8.5)
eGFR: 112 mL/min/{1.73_m2} (ref >59)

## 2023-05-05 LAB — CBC WITH DIFFERENTIAL/PLATELET
Basophils Absolute: 0 10*3/uL (ref 0.0–0.2)
Basos: 0 %
EOS (ABSOLUTE): 0.2 10*3/uL (ref 0.0–0.4)
Eos: 2 %
Hematocrit: 40.7 % (ref 37.5–51.0)
Hemoglobin: 13.1 g/dL (ref 13.0–17.7)
Immature Grans (Abs): 0 10*3/uL (ref 0.0–0.1)
Immature Granulocytes: 0 %
Lymphocytes Absolute: 3.9 10*3/uL — ABNORMAL HIGH (ref 0.7–3.1)
Lymphs: 56 %
MCH: 27.1 pg (ref 26.6–33.0)
MCHC: 32.2 g/dL (ref 31.5–35.7)
MCV: 84 fL (ref 79–97)
Monocytes Absolute: 0.7 10*3/uL (ref 0.1–0.9)
Monocytes: 10 %
Neutrophils Absolute: 2.3 10*3/uL (ref 1.4–7.0)
Neutrophils: 32 %
Platelets: 309 10*3/uL (ref 150–450)
RBC: 4.83 x10E6/uL (ref 4.14–5.80)
RDW: 14.5 % (ref 11.6–15.4)
WBC: 7.1 10*3/uL (ref 3.4–10.8)

## 2023-05-05 LAB — LIPID PANEL
Cholesterol, Total: 160 mg/dL (ref 100–199)
HDL: 37 mg/dL — ABNORMAL LOW (ref >39)
LDL CALC COMMENT:: 4.3 ratio (ref 0.0–5.0)
LDL Chol Calc (NIH): 104 mg/dL — ABNORMAL HIGH (ref 0–99)
Triglycerides: 103 mg/dL (ref 0–149)
VLDL Cholesterol Cal: 19 mg/dL (ref 5–40)

## 2023-05-05 LAB — PSA: Prostate Specific Ag, Serum: 0.3 ng/mL (ref 0.0–4.0)

## 2023-05-05 LAB — VITAMIN D 25 HYDROXY (VIT D DEFICIENCY, FRACTURES): Vit D, 25-Hydroxy: 32.1 ng/mL (ref 30.0–100.0)

## 2023-05-08 ENCOUNTER — Telehealth: Payer: Self-pay | Admitting: Family Medicine

## 2023-05-08 NOTE — Telephone Encounter (Signed)
Patient would like a call back regarding his labs results he would like a call in the morning around 8

## 2023-05-08 NOTE — Progress Notes (Signed)
Cholesterol improved! Recommend remaining on lipitor as he has an elevated risk score. The 10-year ASCVD risk score (Arnett DK, et al., 2019) is: 11.5% Slightly elevated lymphocytes, not concerning at this time. All other labs normal!

## 2023-05-09 NOTE — Telephone Encounter (Signed)
See result notes. 

## 2023-05-22 ENCOUNTER — Encounter: Payer: Self-pay | Admitting: *Deleted

## 2023-07-06 ENCOUNTER — Ambulatory Visit: Payer: Commercial Managed Care - PPO | Admitting: Family Medicine

## 2023-07-27 ENCOUNTER — Ambulatory Visit (INDEPENDENT_AMBULATORY_CARE_PROVIDER_SITE_OTHER): Payer: Commercial Managed Care - PPO | Admitting: Family Medicine

## 2023-07-27 ENCOUNTER — Encounter: Payer: Self-pay | Admitting: Family Medicine

## 2023-07-27 VITALS — BP 124/82 | HR 56 | Temp 98.0°F | Ht 70.0 in | Wt 218.0 lb

## 2023-07-27 DIAGNOSIS — I10 Essential (primary) hypertension: Secondary | ICD-10-CM

## 2023-07-27 DIAGNOSIS — Z720 Tobacco use: Secondary | ICD-10-CM | POA: Insufficient documentation

## 2023-07-27 DIAGNOSIS — E559 Vitamin D deficiency, unspecified: Secondary | ICD-10-CM

## 2023-07-27 DIAGNOSIS — M25551 Pain in right hip: Secondary | ICD-10-CM

## 2023-07-27 DIAGNOSIS — F418 Other specified anxiety disorders: Secondary | ICD-10-CM

## 2023-07-27 DIAGNOSIS — E782 Mixed hyperlipidemia: Secondary | ICD-10-CM

## 2023-07-27 DIAGNOSIS — G8929 Other chronic pain: Secondary | ICD-10-CM

## 2023-07-27 DIAGNOSIS — F339 Major depressive disorder, recurrent, unspecified: Secondary | ICD-10-CM

## 2023-07-27 MED ORDER — NICOTINE POLACRILEX 4 MG MT GUM: 4.0000 mg | CHEWING_GUM | OROMUCOSAL | 0 refills | Status: DC | PRN

## 2023-07-27 MED ORDER — PREDNISONE 20 MG PO TABS: 40.0000 mg | ORAL_TABLET | Freq: Every day | ORAL | 0 refills | Status: AC

## 2023-07-27 NOTE — Progress Notes (Signed)
 Subjective:  Patient ID: Christian Cook, male    DOB: 1980-10-18, 43 y.o.   MRN: [id number]  Patient Care Team: Arrie Senate, FNP as PCP - General (Family Medicine)   Chief Complaint:  Medical Management of Chronic Issues (3 month follow up/Right hip pain)  HPI: Christian Cook is a 43 y.o. male presenting on 07/27/2023 for Medical Management of Chronic Issues (3 month follow up/Right hip pain)  HPI 1. Primary hypertension Has BP monitor at home Yes BP at home average 120/80s  ROS Denies  fatigue, peripheral edema, changes to vision, chest pain, headaches, palpitations, sweats, SOB, PND, orthopnea Meds atenolol denies side effects  CAD risks hypertension, hypercholesterolemia/hyperlipidemia  2. Anxiety associated with depression/3. Depression, recurrent (HCC) Taking trazodone. States that he is sleeping well. Asleep within 20 minutes of taking. States that it is really helping with his thoughts before bed. States that anxiety and depression are doing well. Denies SI. States that he is interested in counseling.   4. Mixed hyperlipidemia Lipid/Cholesterol, Follow-up  Last lipid panel Other pertinent labs  Lab Results  Component Value Date   CHOL 160 05/04/2023   HDL 37 (L) 05/04/2023   LDLCALC 104 (H) 05/04/2023   TRIG 103 05/04/2023   CHOLHDL 4.3 05/04/2023   Lab Results  Component Value Date   ALT 15 05/04/2023   AST 18 05/04/2023   PLT 309 05/04/2023   TSH 1.440 09/01/2022     He was last seen for this 3 months ago.  Management since that visit includes Lipitor   He reports fair compliance with treatment. Taking in am before going to sleep. Works Engineer, water.  He is not having side effects.   Symptoms: No chest pain No chest pressure/discomfort  No dyspnea No lower extremity edema  No numbness or tingling of extremity No orthopnea  No palpitations No paroxysmal nocturnal dyspnea  No speech difficulty No syncope   Current diet: well  balanced Current exercise:  labor job   The 10-year ASCVD risk score (Arnett DK, et al., 2019) is: 9.5%  ---------------------------------------------------------------------------------------------------   5. Vitamin D deficiency Vitamin D deficiency, follow-up  Lab Results  Component Value Date   VD25OH 32.1 05/04/2023   VD25OH 12.5 (L) 09/01/2022   CALCIUM 9.3 05/04/2023   CALCIUM 9.7 09/01/2022   Wt Readings from Last 3 Encounters:  07/27/23 218 lb (98.9 kg)  05/04/23 213 lb (96.6 kg)  10/03/22 213 lb (96.6 kg)   He was last seen for vitamin D deficiency 2 months ago.  Management since that visit includes - not currently taking  He reports poor compliance with treatment. He is not having side effects.   Symptoms: No change in energy level No numbness or tingling  No bone pain No unexplained fracture   --------------------------------------------------------------------------------------------------- 6. Chronic right hip pain States that he has aggravation from work. States that after work he has to lay in bed. Cannot do the activities that he would like to due to pain. States that he is doing a lot of walking and heavy lifting. States that they are short-staffed and he is having to work more. He is interested in following with PT and ortho. He is no longer following with pain management as he is did not like the way the medications made him feel.   7. Tobacco Use  Smokes 1/2 PPD  Started at 43 years old  States that he is interested in quitting. Has tried in the past.    Relevant past medical,  surgical, family, and social history reviewed and updated as indicated.  Allergies and medications reviewed and updated. Data reviewed: Chart in Epic.   History reviewed. No pertinent past medical history.  Past Surgical History:  Procedure Laterality Date   HAND SURGERY     HIP SURGERY      Social History   Socioeconomic History   Marital status: Single    Spouse  name: Not on file   Number of children: Not on file   Years of education: Not on file   Highest education level: GED or equivalent  Occupational History   Not on file  Tobacco Use   Smoking status: Every Day   Smokeless tobacco: Never  Substance and Sexual Activity   Alcohol use: Not Currently   Drug use: Not Currently   Sexual activity: Not on file  Other Topics Concern   Not on file  Social History Narrative   Not on file   Social Drivers of Health   Financial Resource Strain: High Risk (07/26/2023)   Overall Financial Resource Strain (CARDIA)    Difficulty of Paying Living Expenses: Hard  Food Insecurity: No Food Insecurity (07/26/2023)   Hunger Vital Sign    Worried About Running Out of Food in the Last Year: Never true    Ran Out of Food in the Last Year: Never true  Transportation Needs: No Transportation Needs (07/26/2023)   PRAPARE - Administrator, Civil Service (Medical): No    Lack of Transportation (Non-Medical): No  Physical Activity: Sufficiently Active (07/26/2023)   Exercise Vital Sign    Days of Exercise per Week: 5 days    Minutes of Exercise per Session: 150+ min  Stress: Stress Concern Present (07/26/2023)   [employer] of Occupational Health - Occupational Stress Questionnaire    Feeling of Stress : Very much  Social Connections: Socially Isolated (07/26/2023)   Social Connection and Isolation Panel [NHANES]    Frequency of Communication with Friends and Family: Once a week    Frequency of Social Gatherings with Friends and Family: Once a week    Attends Religious Services: Never    Database administrator or Organizations: No    Attends Banker Meetings: Not on file    Marital Status: Separated  Intimate Partner Violence: Not on file    Outpatient Encounter Medications as of 07/27/2023  Medication Sig   atenolol (TENORMIN) 50 MG tablet Take 1 tablet (50 mg total) by mouth daily.   atorvastatin (LIPITOR) 20 MG tablet Take 1  tablet (20 mg total) by mouth daily.   hydrOXYzine (VISTARIL) 25 MG capsule Take 1 capsule (25 mg total) by mouth daily as needed for anxiety.   ibuprofen (ADVIL,MOTRIN) 600 MG tablet Take 1 tablet (600 mg total) by mouth 4 (four) times daily.   traZODone (DESYREL) 50 MG tablet Take 1 tablet (50 mg total) by mouth at bedtime as needed for sleep.   No facility-administered encounter medications on file as of 07/27/2023.    No Known Allergies  Review of Systems As per HPI  Objective:  BP 124/82   Pulse (!) 56   Temp 98 F (36.7 C)   Ht 5\' 10"  (1.778 m)   Wt 218 lb (98.9 kg)   SpO2 99%   BMI 31.28 kg/m    Wt Readings from Last 3 Encounters:  07/27/23 218 lb (98.9 kg)  05/04/23 213 lb (96.6 kg)  10/03/22 213 lb (96.6 kg)   Physical Exam Constitutional:  General: He is awake. He is not in acute distress.    Appearance: Normal appearance. He is well-developed, well-groomed and overweight. He is not ill-appearing, toxic-appearing or diaphoretic.  Cardiovascular:     Rate and Rhythm: Normal rate and regular rhythm.     Pulses: Normal pulses.          Radial pulses are 2+ on the right side and 2+ on the left side.       Posterior tibial pulses are 2+ on the right side and 2+ on the left side.     Heart sounds: Normal heart sounds. No murmur heard.    No gallop.  Pulmonary:     Effort: Pulmonary effort is normal. No respiratory distress.     Breath sounds: Normal breath sounds. No stridor. No wheezing, rhonchi or rales.  Musculoskeletal:     Cervical back: Full passive range of motion without pain and neck supple.     Right lower leg: No edema.     Left lower leg: No edema.  Skin:    General: Skin is warm.     Capillary Refill: Capillary refill takes less than 2 seconds.  Neurological:     General: No focal deficit present.     Mental Status: He is alert, oriented to person, place, and time and easily aroused. Mental status is at baseline.     GCS: GCS eye subscore is 4.  GCS verbal subscore is 5. GCS motor subscore is 6.     Motor: No weakness.  Psychiatric:        Attention and Perception: Attention and perception normal.        Mood and Affect: Mood and affect normal.        Speech: Speech normal.        Behavior: Behavior normal. Behavior is cooperative.        Thought Content: Thought content normal. Thought content does not include homicidal or suicidal ideation. Thought content does not include homicidal or suicidal plan.        Cognition and Memory: Cognition and memory normal.        Judgment: Judgment normal.     Results for orders placed or performed in visit on 05/04/23  PSA   Collection Time: 05/04/23 10:56 AM  Result Value Ref Range   Prostate Specific Ag, Serum 0.3 0.0 - 4.0 ng/mL  Lipid panel   Collection Time: 05/04/23 10:56 AM  Result Value Ref Range   Cholesterol, Total 160 100 - 199 mg/dL   Triglycerides 829 0 - 149 mg/dL   HDL 37 (L) >56 mg/dL   VLDL Cholesterol Cal 19 5 - 40 mg/dL   LDL Chol Calc (NIH) 213 (H) 0 - 99 mg/dL   Chol/HDL Ratio 4.3 0.0 - 5.0 ratio  VITAMIN D 25 Hydroxy (Vit-D Deficiency, Fractures)   Collection Time: 05/04/23 10:56 AM  Result Value Ref Range   Vit D, 25-Hydroxy 32.1 30.0 - 100.0 ng/mL  CBC with Differential/Platelet   Collection Time: 05/04/23 10:56 AM  Result Value Ref Range   WBC 7.1 3.4 - 10.8 x10E3/uL   RBC 4.83 4.14 - 5.80 x10E6/uL   Hemoglobin 13.1 13.0 - 17.7 g/dL   Hematocrit 08.6 57.8 - 51.0 %   MCV 84 79 - 97 fL   MCH 27.1 26.6 - 33.0 pg   MCHC 32.2 31.5 - 35.7 g/dL   RDW 46.9 62.9 - 52.8 %   Platelets 309 150 - 450 x10E3/uL   Neutrophils 32 Not  Estab. %   Lymphs 56 Not Estab. %   Monocytes 10 Not Estab. %   Eos 2 Not Estab. %   Basos 0 Not Estab. %   Neutrophils Absolute 2.3 1.4 - 7.0 x10E3/uL   Lymphocytes Absolute 3.9 (H) 0.7 - 3.1 x10E3/uL   Monocytes Absolute 0.7 0.1 - 0.9 x10E3/uL   EOS (ABSOLUTE) 0.2 0.0 - 0.4 x10E3/uL   Basophils Absolute 0.0 0.0 - 0.2 x10E3/uL    Immature Granulocytes 0 Not Estab. %   Immature Grans (Abs) 0.0 0.0 - 0.1 x10E3/uL  Comprehensive metabolic panel   Collection Time: 05/04/23 10:56 AM  Result Value Ref Range   Glucose 86 70 - 99 mg/dL   BUN 12 6 - 24 mg/dL   Creatinine, Ser 1.61 0.76 - 1.27 mg/dL   eGFR 096 >04 VW/UJW/1.19   BUN/Creatinine Ratio 14 9 - 20   Sodium 141 134 - 144 mmol/L   Potassium 4.8 3.5 - 5.2 mmol/L   Chloride 102 96 - 106 mmol/L   CO2 25 20 - 29 mmol/L   Calcium 9.3 8.7 - 10.2 mg/dL   Total Protein 6.8 6.0 - 8.5 g/dL   Albumin 4.1 4.1 - 5.1 g/dL   Globulin, Total 2.7 1.5 - 4.5 g/dL   Bilirubin Total 0.3 0.0 - 1.2 mg/dL   Alkaline Phosphatase 96 44 - 121 IU/L   AST 18 0 - 40 IU/L   ALT 15 0 - 44 IU/L       07/27/2023   10:14 AM 05/04/2023    8:36 AM 10/03/2022    8:09 AM 09/01/2022    1:52 PM 06/04/2020   10:14 AM  Depression screen PHQ 2/9  Decreased Interest 3 2 1 2  0  Down, Depressed, Hopeless 1 2 1  0 0  PHQ - 2 Score 4 4 2 2  0  Altered sleeping 1  1 2    Tired, decreased energy 3 2 1 3    Change in appetite 2 1 2 1    Feeling bad or failure about yourself  0 0 0 0   Trouble concentrating 0 0 0 0   Moving slowly or fidgety/restless 0 0 0 3   Suicidal thoughts 0 0 2 0   PHQ-9 Score 10  8 11    Difficult doing work/chores Very difficult Not difficult at all Somewhat difficult Very difficult        07/27/2023   10:15 AM 05/04/2023    8:36 AM 10/03/2022    8:10 AM 09/01/2022    1:53 PM  GAD 7 : Generalized Anxiety Score  Nervous, Anxious, on Edge 3 2 1 3   Control/stop worrying 3 1 2  0  Worry too much - different things 3 1 2  0  Trouble relaxing 3 1 1 3   Restless 2 1 1 2   Easily annoyed or irritable 3 2 2 2   Afraid - awful might happen 1 1 2  0  Total GAD 7 Score 18 9 11 10   Anxiety Difficulty Very difficult Not difficult at all Somewhat difficult Very difficult   Pertinent labs & imaging results that were available during my care of the patient were reviewed by me and considered in  my medical decision making.  Assessment & Plan:  Hoy was seen today for medical management of chronic issues.  Diagnoses and all orders for this visit:  1. Primary hypertension (Primary) Well controlled. Continue current regimen.   2. Anxiety associated with depression Not at goal. He is doing well with current regimen. Is  interested in counseling. Denies SI. Safety contract established. Patient and/or legal guardian verbally consented to [facility] services about presenting concerns and psychiatric consultation as appropriate.  The services will be billed as appropriate for the patient. - Ambulatory referral to Integrated Behavioral Health  3. Depression, recurrent (HCC) As above.  - Ambulatory referral to Integrated Behavioral Health  4. Mixed hyperlipidemia Continue on current regimen. Will repeat with fasting labs at next appt.   5. Vitamin D deficiency Discussed with patient taking OTC supplementation to maintain vitamin D. Will repeat labs at next appt.   6. Chronic right hip pain Will provide medication as below. Provided patient with light duty/work restrictions of not lifting >30 lbs without assistance for 30 days until he can be evaluated by Ortho and PT.  - Ambulatory referral to Orthopedic Surgery - predniSONE (DELTASONE) 20 MG tablet; Take 2 tablets (40 mg total) by mouth daily with breakfast for 5 days.  Dispense: 10 tablet; Refill: 0 - Ambulatory referral to Physical Therapy  7. Tobacco abuse Interested in quitting. Do no wish to start Wellbutrin due to patient anxiety. Will send in nicorette as below to assist with quitting. Can consider chantix in the future.  - nicotine polacrilex (NICORETTE) 4 MG gum; Take 1 each (4 mg total) by mouth as needed for smoking cessation.  Dispense: 100 tablet; Refill: 0  Continue all other maintenance medications.  Follow up plan: Return in about 6 months (around 01/27/2024) for Chronic Condition Follow  up with fasting labs .   Continue healthy lifestyle choices, including diet (rich in fruits, vegetables, and lean proteins, and low in salt and simple carbohydrates) and exercise (at least 30 minutes of moderate physical activity daily).  Written and verbal instructions provided   The above assessment and management plan was discussed with the patient. The patient verbalized understanding of and has agreed to the management plan. Patient is aware to call the clinic if they develop any new symptoms or if symptoms persist or worsen. Patient is aware when to return to the clinic for a follow-up visit. Patient educated on when it is appropriate to go to the emergency department.   Neale Burly, DNP-FNP [facility] [address] [place], [place] [address] [phone]

## 2023-07-27 NOTE — Patient Instructions (Signed)
 Take a daily OTC vitamin D supplement with 1000-2000 IU.

## 2023-07-28 ENCOUNTER — Other Ambulatory Visit: Payer: Self-pay | Admitting: Family Medicine

## 2023-07-28 DIAGNOSIS — I10 Essential (primary) hypertension: Secondary | ICD-10-CM

## 2023-08-14 ENCOUNTER — Other Ambulatory Visit: Payer: Self-pay | Admitting: Family Medicine

## 2023-08-14 DIAGNOSIS — G4726 Circadian rhythm sleep disorder, shift work type: Secondary | ICD-10-CM

## 2023-08-21 ENCOUNTER — Institutional Professional Consult (permissible substitution): Admitting: Professional Counselor

## 2023-08-21 ENCOUNTER — Encounter: Payer: Self-pay | Admitting: Family Medicine

## 2023-08-31 ENCOUNTER — Ambulatory Visit: Admitting: Family Medicine

## 2023-08-31 ENCOUNTER — Encounter: Payer: Self-pay | Admitting: Family Medicine

## 2023-09-04 ENCOUNTER — Telehealth: Payer: Self-pay | Admitting: Family Medicine

## 2023-09-05 ENCOUNTER — Encounter: Payer: Self-pay | Admitting: Family Medicine

## 2023-09-05 ENCOUNTER — Other Ambulatory Visit

## 2023-09-05 ENCOUNTER — Ambulatory Visit: Admitting: Family Medicine

## 2023-09-05 VITALS — BP 114/75 | HR 66 | Temp 98.3°F | Ht 70.0 in | Wt 216.0 lb

## 2023-09-05 DIAGNOSIS — R079 Chest pain, unspecified: Secondary | ICD-10-CM

## 2023-09-05 MED ORDER — PREDNISONE 20 MG PO TABS: 40.0000 mg | ORAL_TABLET | Freq: Every day | ORAL | 0 refills | Status: AC

## 2023-09-05 NOTE — Progress Notes (Signed)
 Subjective:  Patient ID: Christian Cook, male    DOB: 05-20-1981, 43 y.o.   MRN: [id number]  Patient Care Team: Arrie Senate, FNP as PCP - General (Family Medicine)   Chief Complaint:  left chest pain  HPI: Christian Cook is a 43 y.o. male presenting on 09/05/2023 for left chest pain  HPI Patient reports that ~2 weeks ago started with sharp, intermittent chest pain under left rib. Wonders if he pulled a muscle as the pain typically happens while he is picking up heavy boxes at work. States that it started 2 weeks ago after he was rushing at work and picking up more boxes than normal. Pain occurred a few times last night, but prior to that was much more frequent. External pressure does not change the pain. Deep breathing does not change pain. States that turning sideways can induce the pain. He admits that he had the pain sometimes while at home relaxing. He works at [employer] with constant motion. Denies shortness of breath, palpitations, sweating. Does not radiate. Reports that it has gotten better over the past two days.   Relevant past medical, surgical, family, and social history reviewed and updated as indicated.  Allergies and medications reviewed and updated. Data reviewed: Chart in Epic.  History reviewed. No pertinent past medical history.  Past Surgical History:  Procedure Laterality Date   HAND SURGERY     HIP SURGERY      Social History   Socioeconomic History   Marital status: Single    Spouse name: Not on file   Number of children: Not on file   Years of education: Not on file   Highest education level: GED or equivalent  Occupational History   Not on file  Tobacco Use   Smoking status: Every Day   Smokeless tobacco: Never  Substance and Sexual Activity   Alcohol use: Not Currently   Drug use: Not Currently   Sexual activity: Not on file  Other Topics Concern   Not on file  Social History Narrative   Not on file   Social Drivers of Health    Financial Resource Strain: High Risk (07/26/2023)   Overall Financial Resource Strain (CARDIA)    Difficulty of Paying Living Expenses: Hard  Food Insecurity: No Food Insecurity (07/26/2023)   Hunger Vital Sign    Worried About Running Out of Food in the Last Year: Never true    Ran Out of Food in the Last Year: Never true  Transportation Needs: No Transportation Needs (07/26/2023)   PRAPARE - Administrator, Civil Service (Medical): No    Lack of Transportation (Non-Medical): No  Physical Activity: Sufficiently Active (07/26/2023)   Exercise Vital Sign    Days of Exercise per Week: 5 days    Minutes of Exercise per Session: 150+ min  Stress: Stress Concern Present (07/26/2023)   [employer] of Occupational Health - Occupational Stress Questionnaire    Feeling of Stress : Very much  Social Connections: Socially Isolated (07/26/2023)   Social Connection and Isolation Panel [NHANES]    Frequency of Communication with Friends and Family: Once a week    Frequency of Social Gatherings with Friends and Family: Once a week    Attends Religious Services: Never    Database administrator or Organizations: No    Attends Banker Meetings: Not on file    Marital Status: Separated  Intimate Partner Violence: Not At Risk (09/05/2023)   Humiliation, Afraid, Rape,  and Kick questionnaire    Fear of Current or Ex-Partner: No    Emotionally Abused: No    Physically Abused: No    Sexually Abused: No    Outpatient Encounter Medications as of 09/05/2023  Medication Sig   atenolol (TENORMIN) 50 MG tablet TAKE ONE (1) TABLET BY MOUTH EVERY DAY   atorvastatin (LIPITOR) 20 MG tablet Take 1 tablet (20 mg total) by mouth daily.   hydrOXYzine (VISTARIL) 25 MG capsule Take 1 capsule (25 mg total) by mouth daily as needed for anxiety.   ibuprofen (ADVIL,MOTRIN) 600 MG tablet Take 1 tablet (600 mg total) by mouth 4 (four) times daily.   nicotine polacrilex (NICORETTE) 4 MG gum Take 1 each  (4 mg total) by mouth as needed for smoking cessation.   traZODone (DESYREL) 50 MG tablet TAKE 1 TABLET BY MOUTH AT BEDTIME AS NEEDED FOR SLEEP   No facility-administered encounter medications on file as of 09/05/2023.    No Known Allergies  Review of Systems As per HPI  Objective:  BP 114/75   Pulse 66   Temp 98.3 F (36.8 C)   Ht 5\' 10"  (1.778 m)   Wt 216 lb (98 kg)   SpO2 98%   BMI 30.99 kg/m    Wt Readings from Last 3 Encounters:  09/05/23 216 lb (98 kg)  07/27/23 218 lb (98.9 kg)  05/04/23 213 lb (96.6 kg)    Physical Exam Constitutional:      General: He is awake. He is not in acute distress.    Appearance: Normal appearance. He is well-developed and well-groomed. He is not ill-appearing, toxic-appearing or diaphoretic.  Cardiovascular:     Rate and Rhythm: Normal rate and regular rhythm.     Pulses: Normal pulses.          Radial pulses are 2+ on the right side and 2+ on the left side.       Posterior tibial pulses are 2+ on the right side and 2+ on the left side.     Heart sounds: Normal heart sounds. No murmur heard.    No gallop.  Pulmonary:     Effort: Pulmonary effort is normal. No respiratory distress.     Breath sounds: Normal breath sounds. No stridor. No wheezing, rhonchi or rales.  Chest:     Chest wall: No mass, lacerations, deformity, swelling, tenderness, crepitus or edema.  Breasts:    Breasts are symmetrical.     Comments: Nontender to palpation  Musculoskeletal:     Cervical back: Full passive range of motion without pain and neck supple.     Right lower leg: No edema.     Left lower leg: No edema.  Skin:    General: Skin is warm.     Capillary Refill: Capillary refill takes less than 2 seconds.  Neurological:     General: No focal deficit present.     Mental Status: He is alert, oriented to person, place, and time and easily aroused. Mental status is at baseline.     GCS: GCS eye subscore is 4. GCS verbal subscore is 5. GCS motor subscore  is 6.     Motor: No weakness.  Psychiatric:        Attention and Perception: Attention and perception normal.        Mood and Affect: Mood and affect normal.        Speech: Speech normal.        Behavior: Behavior normal. Behavior is cooperative.  Thought Content: Thought content normal. Thought content does not include homicidal or suicidal ideation. Thought content does not include homicidal or suicidal plan.        Cognition and Memory: Cognition and memory normal.        Judgment: Judgment normal.     Results for orders placed or performed in visit on 05/04/23  PSA   Collection Time: 05/04/23 10:56 AM  Result Value Ref Range   Prostate Specific Ag, Serum 0.3 0.0 - 4.0 ng/mL  Lipid panel   Collection Time: 05/04/23 10:56 AM  Result Value Ref Range   Cholesterol, Total 160 100 - 199 mg/dL   Triglycerides 161 0 - 149 mg/dL   HDL 37 (L) >09 mg/dL   VLDL Cholesterol Cal 19 5 - 40 mg/dL   LDL Chol Calc (NIH) 604 (H) 0 - 99 mg/dL   Chol/HDL Ratio 4.3 0.0 - 5.0 ratio  VITAMIN D 25 Hydroxy (Vit-D Deficiency, Fractures)   Collection Time: 05/04/23 10:56 AM  Result Value Ref Range   Vit D, 25-Hydroxy 32.1 30.0 - 100.0 ng/mL  CBC with Differential/Platelet   Collection Time: 05/04/23 10:56 AM  Result Value Ref Range   WBC 7.1 3.4 - 10.8 x10E3/uL   RBC 4.83 4.14 - 5.80 x10E6/uL   Hemoglobin 13.1 13.0 - 17.7 g/dL   Hematocrit 54.0 98.1 - 51.0 %   MCV 84 79 - 97 fL   MCH 27.1 26.6 - 33.0 pg   MCHC 32.2 31.5 - 35.7 g/dL   RDW 19.1 47.8 - 29.5 %   Platelets 309 150 - 450 x10E3/uL   Neutrophils 32 Not Estab. %   Lymphs 56 Not Estab. %   Monocytes 10 Not Estab. %   Eos 2 Not Estab. %   Basos 0 Not Estab. %   Neutrophils Absolute 2.3 1.4 - 7.0 x10E3/uL   Lymphocytes Absolute 3.9 (H) 0.7 - 3.1 x10E3/uL   Monocytes Absolute 0.7 0.1 - 0.9 x10E3/uL   EOS (ABSOLUTE) 0.2 0.0 - 0.4 x10E3/uL   Basophils Absolute 0.0 0.0 - 0.2 x10E3/uL   Immature Granulocytes 0 Not Estab. %    Immature Grans (Abs) 0.0 0.0 - 0.1 x10E3/uL  Comprehensive metabolic panel   Collection Time: 05/04/23 10:56 AM  Result Value Ref Range   Glucose 86 70 - 99 mg/dL   BUN 12 6 - 24 mg/dL   Creatinine, Ser 6.21 0.76 - 1.27 mg/dL   eGFR 308 >65 HQ/ION/6.29   BUN/Creatinine Ratio 14 9 - 20   Sodium 141 134 - 144 mmol/L   Potassium 4.8 3.5 - 5.2 mmol/L   Chloride 102 96 - 106 mmol/L   CO2 25 20 - 29 mmol/L   Calcium 9.3 8.7 - 10.2 mg/dL   Total Protein 6.8 6.0 - 8.5 g/dL   Albumin 4.1 4.1 - 5.1 g/dL   Globulin, Total 2.7 1.5 - 4.5 g/dL   Bilirubin Total 0.3 0.0 - 1.2 mg/dL   Alkaline Phosphatase 96 44 - 121 IU/L   AST 18 0 - 40 IU/L   ALT 15 0 - 44 IU/L       09/05/2023    8:26 AM 07/27/2023   10:14 AM 05/04/2023    8:36 AM 10/03/2022    8:09 AM 09/01/2022    1:52 PM  Depression screen PHQ 2/9  Decreased Interest 0 3 2 1 2   Down, Depressed, Hopeless 0 1 2 1  0  PHQ - 2 Score 0 4 4 2 2   Altered sleeping 0  1  1 2   Tired, decreased energy 0 3 2 1 3   Change in appetite 0 2 1 2 1   Feeling bad or failure about yourself  0 0 0 0 0  Trouble concentrating 0 0 0 0 0  Moving slowly or fidgety/restless 0 0 0 0 3  Suicidal thoughts 0 0 0 2 0  PHQ-9 Score 0 10  8 11   Difficult doing work/chores  Very difficult Not difficult at all Somewhat difficult Very difficult       09/05/2023    8:26 AM 07/27/2023   10:15 AM 05/04/2023    8:36 AM 10/03/2022    8:10 AM  GAD 7 : Generalized Anxiety Score  Nervous, Anxious, on Edge 2 3 2 1   Control/stop worrying 1 3 1 2   Worry too much - different things 1 3 1 2   Trouble relaxing 0 3 1 1   Restless 0 2 1 1   Easily annoyed or irritable 1 3 2 2   Afraid - awful might happen 0 1 1 2   Total GAD 7 Score 5 18 9 11   Anxiety Difficulty  Very difficult Not difficult at all Somewhat difficult    Pertinent labs & imaging results that were available during my care of the patient were reviewed by me and considered in my medical decision making.  Assessment &  Plan:  Christian Cook was seen today for left chest pain.  Diagnoses and all orders for this visit:  Left-sided chest pain Suspect Costochondritis, therefore will treat with prednisone as below. Not actively in pain, so EKG not completed in office. Will complete zio for further evaluation. Discussed red flag symptoms with patient and when to follow up.  -     predniSONE (DELTASONE) 20 MG tablet; Take 2 tablets (40 mg total) by mouth daily with breakfast for 5 days. -     LONG TERM MONITOR (3-14 DAYS); Future  Continue all other maintenance medications.  Follow up plan: Return if symptoms worsen or fail to improve.   Continue healthy lifestyle choices, including diet (rich in fruits, vegetables, and lean proteins, and low in salt and simple carbohydrates) and exercise (at least 30 minutes of moderate physical activity daily).  Written and verbal instructions provided   The above assessment and management plan was discussed with the patient. The patient verbalized understanding of and has agreed to the management plan. Patient is aware to call the clinic if they develop any new symptoms or if symptoms persist or worsen. Patient is aware when to return to the clinic for a follow-up visit. Patient educated on when it is appropriate to go to the emergency department.   Jacqualyn Mates, DNP-FNP [facility] [address] [place], [place] [address] [phone]

## 2023-09-10 ENCOUNTER — Ambulatory Visit (INDEPENDENT_AMBULATORY_CARE_PROVIDER_SITE_OTHER): Admitting: Professional Counselor

## 2023-09-10 DIAGNOSIS — F411 Generalized anxiety disorder: Secondary | ICD-10-CM
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
## 2023-09-19 ENCOUNTER — Other Ambulatory Visit: Payer: Self-pay | Admitting: Family Medicine

## 2023-09-19 ENCOUNTER — Telehealth (INDEPENDENT_AMBULATORY_CARE_PROVIDER_SITE_OTHER): Payer: Self-pay | Admitting: Professional Counselor

## 2023-09-19 DIAGNOSIS — F4310 Post-traumatic stress disorder, unspecified: Secondary | ICD-10-CM | POA: Diagnosis not present

## 2023-09-19 DIAGNOSIS — E782 Mixed hyperlipidemia: Secondary | ICD-10-CM

## 2023-09-19 MED ORDER — ATORVASTATIN CALCIUM 20 MG PO TABS: 20.0000 mg | ORAL_TABLET | Freq: Every day | ORAL | 0 refills | Status: DC
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
## 2023-09-19 NOTE — Addendum Note (Signed)
 Addended by: Alfonzo Ill on: 09/19/2023 01:40 PM   Modules accepted: Orders

## 2023-09-20 ENCOUNTER — Encounter: Payer: Self-pay | Admitting: *Deleted

## 2023-09-21 ENCOUNTER — Telehealth: Payer: Self-pay | Admitting: Family Medicine

## 2023-09-21 NOTE — Telephone Encounter (Signed)
 Based on recommendations from notes with Polly Brink and psychiatry, recommended patient start zoloft . I can send that in if patient would like to start.

## 2023-09-24 ENCOUNTER — Encounter: Admitting: Professional Counselor

## 2023-09-24 NOTE — Telephone Encounter (Signed)
 Called patient, no answer, no voicemail set up.

## 2023-09-27 ENCOUNTER — Ambulatory Visit: Admitting: Family Medicine

## 2023-09-27 ENCOUNTER — Encounter: Payer: Self-pay | Admitting: Family Medicine

## 2023-09-27 NOTE — Progress Notes (Signed)
 Continue atenolol . Report persistent or worsening symptoms. Avoid caffeine and substance use.

## 2023-10-02 ENCOUNTER — Ambulatory Visit: Payer: Self-pay | Admitting: *Deleted

## 2023-10-02 NOTE — Telephone Encounter (Signed)
 Attempted to contact - NA

## 2023-10-03 NOTE — BH Specialist Note (Signed)
 Service was provided on 09/19/2023 but note was dated 09/21/2023 in error.

## 2023-10-04 NOTE — Telephone Encounter (Signed)
 Attempted to contact, NA, NVM.  Mychart message sent.  Call is closed.

## 2023-11-29 ENCOUNTER — Other Ambulatory Visit: Payer: Self-pay | Admitting: *Deleted

## 2023-11-29 DIAGNOSIS — G4726 Circadian rhythm sleep disorder, shift work type: Secondary | ICD-10-CM

## 2023-11-30 MED ORDER — TRAZODONE HCL 50 MG PO TABS: 50.0000 mg | ORAL_TABLET | Freq: Every evening | ORAL | 0 refills | Status: DC | PRN

## 2023-12-28 ENCOUNTER — Other Ambulatory Visit: Payer: Self-pay | Admitting: *Deleted

## 2023-12-28 DIAGNOSIS — E782 Mixed hyperlipidemia: Secondary | ICD-10-CM

## 2023-12-28 MED ORDER — ATORVASTATIN CALCIUM 20 MG PO TABS: 20.0000 mg | ORAL_TABLET | Freq: Every day | ORAL | 0 refills | Status: DC

## 2024-01-29 NOTE — Progress Notes (Unsigned)
 Established Patient Office Visit  Subjective  Patient ID: Christian [name], male    DOB: 11-07-1980  Age: 43 y.o. MRN: [id number]  No chief complaint on file.   HPI Christian [name], Follow-up  Last lipid panel Other pertinent labs  Lab Results  Component Value Date   CHOL 160 05/04/2023   HDL 37 (L) 05/04/2023   LDLCALC 104 (H) 05/04/2023   TRIG 103 05/04/2023   CHOLHDL 4.3 05/04/2023   Lab Results  Component Value Date   ALT 15 05/04/2023   AST 18 05/04/2023   PLT 309 05/04/2023   TSH 1.440 09/01/2022     He was last seen for this {1-12:18279} {days/wks/mos/yrs:310907} ago.  Management since that visit includes ***.  He reports {excellent/good/fair/poor:19665} compliance with treatment. He {is/is not:9024} having side effects. {document side effects if present:1}  Symptoms: {Yes/No:20286} chest pain {Yes/No:20286} chest pressure/discomfort  {Yes/No:20286} dyspnea {Yes/No:20286} lower extremity edema  {Yes/No:20286} numbness or tingling of extremity {Yes/No:20286} orthopnea  {Yes/No:20286} palpitations {Yes/No:20286} paroxysmal nocturnal dyspnea  {Yes/No:20286} speech difficulty {Yes/No:20286} syncope   Current diet: {diet habits:16563} Current exercise: {exercise types:16438}  The 10-year ASCVD risk score (Arnett DK, et al., 2019) is: 8.1%  [name], Follow-up  Last lipid panel Other pertinent labs  Lab Results  Component Value Date   CHOL 160 05/04/2023   HDL 37 (L) 05/04/2023   LDLCALC 104 (H) 05/04/2023   TRIG 103 05/04/2023   CHOLHDL 4.3 05/04/2023   Lab Results  Component Value Date   ALT 15 05/04/2023   AST 18 05/04/2023   PLT 309 05/04/2023   TSH 1.440 09/01/2022     He was last seen for this {1-12:18279} {days/wks/mos/yrs:310907} ago.  Management since that visit includes ***.  He reports {excellent/good/fair/poor:19665} compliance with treatment. He {is/is not:9024} having side effects. {document side effects if  present:1}  Symptoms: {Yes/No:20286} chest pain {Yes/No:20286} chest pressure/discomfort  {Yes/No:20286} dyspnea {Yes/No:20286} lower extremity edema  {Yes/No:20286} numbness or tingling of extremity {Yes/No:20286} orthopnea  {Yes/No:20286} palpitations {Yes/No:20286} paroxysmal nocturnal dyspnea  {Yes/No:20286} speech difficulty {Yes/No:20286} syncope   Current diet: {diet habits:16563} Current exercise: {exercise types:16438}  The 10-year ASCVD risk score (Arnett DK, et al., 2019) is: 8.1%  Depression, Follow-up  He  was last seen for this {NUMBERS 1-12:18279} {days/wks/mos/yrs:310907} ago. Changes made at last visit include ***.   He reports {excellent/good/fair/poor:19665} compliance with treatment. He {is/is not:21021397} having side effects. ***  He reports {DESC; GOOD/FAIR/POOR:18685} tolerance of treatment. Current symptoms include: {Symptoms; depression:1002} He feels he is {improved/worse/unchanged:3041574} since last visit.     09/10/2023    9:16 AM 09/05/2023    8:26 AM 07/27/2023   10:14 AM  Depression screen PHQ 2/9  Decreased Interest 1 0 3  Down, Depressed, Hopeless 0 0 1  PHQ - 2 Score 1 0 4  Altered sleeping 1 0 1  Tired, decreased energy 2 0 3  Change in appetite 0 0 2  Feeling bad or failure about yourself  0 0 0  Trouble concentrating 0 0 0  Moving slowly or fidgety/restless 0 0 0  Suicidal thoughts 0 0 0  PHQ-9 Score 4 0 10  Difficult doing work/chores   Very difficult    Vitamin D  deficiency, follow-up  Lab Results  Component Value Date   VD25OH 32.1 05/04/2023   VD25OH 12.5 (L) 09/01/2022   CALCIUM  9.3 05/04/2023   CALCIUM  9.7 09/01/2022        Wt Readings from Last 3 Encounters:  09/05/23 216 lb (98 kg)  07/27/23 218 lb (98.9 kg)  05/04/23 213 lb (96.6 kg)    He was last seen for vitamin D  deficiency {NUMBERS 1-12:18279} {days/wks/mos/yrs:310907} ago.  Management since that visit includes ***. He reports  {excellent/good/fair/poor:19665} compliance with treatment. He {is/is not:21021397} having side effects. {document side effects if present:1}  Symptoms: {Yes/No:20286} change in energy level {Yes/No:20286} numbness or tingling  {Yes/No:20286} bone pain {Yes/No:20286} unexplained fracture   ---------------------------------------------------------------------------------------------------  Patient Active Problem List   Diagnosis Date Noted   Tobacco abuse 07/27/2023   Anxiety associated with depression 05/04/2023   Hyperlipidemia 10/03/2022   Vitamin D  deficiency 10/03/2022   Anxiety attack 09/01/2022   Depression, recurrent (HCC) 09/01/2022   Chronic right hip pain 02/24/2022   Primary hypertension 06/04/2020   No past medical history on file. Past Surgical History:  Procedure Laterality Date   HAND SURGERY     HIP SURGERY     Social History   Tobacco Use   Smoking status: Every Day   Smokeless tobacco: Never  Substance Use Topics   Alcohol use: Not Currently   Drug use: Not Currently   Social History   Socioeconomic History   Marital status: Single    Spouse name: Not on file   Number of children: Not on file   Years of education: Not on file   Highest education level: GED or equivalent  Occupational History   Not on file  Tobacco Use   Smoking status: Every Day   Smokeless tobacco: Never  Substance and Sexual Activity   Alcohol use: Not Currently   Drug use: Not Currently   Sexual activity: Not on file  Other Topics Concern   Not on file  Social History Narrative   Not on file   Social Drivers of Health   Financial Resource Strain: High Risk (07/26/2023)   Overall Financial Resource Strain (CARDIA)    Difficulty of Paying Living Expenses: Hard  Food Insecurity: No Food Insecurity (07/26/2023)   Hunger Vital Sign    Worried About Running Out of Food in the Last Year: Never true    Ran Out of Food in the Last Year: Never true  Transportation Needs: No  Transportation Needs (07/26/2023)   PRAPARE - Administrator, Civil Service (Medical): No    Lack of Transportation (Non-Medical): No  Physical Activity: Sufficiently Active (07/26/2023)   Exercise Vital Sign    Days of Exercise per Week: 5 days    Minutes of Exercise per Session: 150+ min  Stress: Stress Concern Present (07/26/2023)   [employer] of Occupational Health - Occupational Stress Questionnaire    Feeling of Stress : Very much  Social Connections: Socially Isolated (07/26/2023)   Social Connection and Isolation Panel    Frequency of Communication with Friends and Family: Once a week    Frequency of Social Gatherings with Friends and Family: Once a week    Attends Religious Services: Never    Database administrator or Organizations: No    Attends Engineer, structural: Not on file    Marital Status: Separated  Intimate Partner Violence: Not At Risk (09/05/2023)   Humiliation, Afraid, Rape, and Kick questionnaire    Fear of Current or Ex-Partner: No    Emotionally Abused: No    Physically Abused: No    Sexually Abused: No   No family status information on file.   No family history on file. No Known Allergies    ROS Negative unless indicated in HPI  Objective:     There were no vitals taken for this visit. BP Readings from Last 3 Encounters:  09/05/23 114/75  07/27/23 124/82  05/04/23 138/84   Wt Readings from Last 3 Encounters:  09/05/23 216 lb (98 kg)  07/27/23 218 lb (98.9 kg)  05/04/23 213 lb (96.6 kg)      Physical Exam   No results found for any visits on 01/30/24.  Last CBC Lab Results  Component Value Date   WBC 7.1 05/04/2023   HGB 13.1 05/04/2023   HCT 40.7 05/04/2023   MCV 84 05/04/2023   MCH 27.1 05/04/2023   RDW 14.5 05/04/2023   PLT 309 05/04/2023   Last metabolic panel Lab Results  Component Value Date   GLUCOSE 86 05/04/2023   NA 141 05/04/2023   K 4.8 05/04/2023   CL 102 05/04/2023   CO2 25 05/04/2023    BUN 12 05/04/2023   CREATININE 0.83 05/04/2023   EGFR 112 05/04/2023   CALCIUM  9.3 05/04/2023   PROT 6.8 05/04/2023   ALBUMIN 4.1 05/04/2023   LABGLOB 2.7 05/04/2023   AGRATIO 1.7 09/01/2022   BILITOT 0.3 05/04/2023   ALKPHOS 96 05/04/2023   AST 18 05/04/2023   ALT 15 05/04/2023   Last lipids Lab Results  Component Value Date   CHOL 160 05/04/2023   HDL 37 (L) 05/04/2023   LDLCALC 104 (H) 05/04/2023   TRIG 103 05/04/2023   CHOLHDL 4.3 05/04/2023   Last hemoglobin A1c No results found for: HGBA1C Last thyroid  functions Lab Results  Component Value Date   TSH 1.440 09/01/2022        Assessment & Plan:  There are no diagnoses linked to this encounter. Continue healthy lifestyle choices, including diet (rich in fruits, vegetables, and lean proteins, and low in salt and simple carbohydrates) and exercise (at least 30 minutes of moderate physical activity daily).     The above assessment and management plan was discussed with the patient. The patient verbalized understanding of and has agreed to the management plan. Patient is aware to call the clinic if they develop any new symptoms or if symptoms persist or worsen. Patient is aware when to return to the clinic for a follow-up visit. Patient educated on when it is appropriate to go to the emergency department.  No follow-ups on file.    Neena Beecham [place] Thompson, DNP [facility] [address] [place], [place] [address] [phone]    Note: This document was prepared by [employer] voice dictation technology and any errors that results from this process are unintentional.

## 2024-01-30 ENCOUNTER — Encounter: Payer: Self-pay | Admitting: Nurse Practitioner

## 2024-01-30 ENCOUNTER — Ambulatory Visit: Admitting: Family Medicine

## 2024-01-30 ENCOUNTER — Ambulatory Visit (INDEPENDENT_AMBULATORY_CARE_PROVIDER_SITE_OTHER): Admitting: Nurse Practitioner

## 2024-01-30 VITALS — BP 114/70 | HR 67 | Temp 97.4°F | Ht 70.0 in | Wt 213.0 lb

## 2024-01-30 DIAGNOSIS — F410 Panic disorder [episodic paroxysmal anxiety] without agoraphobia: Secondary | ICD-10-CM

## 2024-01-30 DIAGNOSIS — I10 Essential (primary) hypertension: Secondary | ICD-10-CM | POA: Diagnosis not present

## 2024-01-30 DIAGNOSIS — F339 Major depressive disorder, recurrent, unspecified: Secondary | ICD-10-CM | POA: Diagnosis not present

## 2024-01-30 DIAGNOSIS — F99 Mental disorder, not otherwise specified: Secondary | ICD-10-CM

## 2024-01-30 DIAGNOSIS — F5105 Insomnia due to other mental disorder: Secondary | ICD-10-CM | POA: Insufficient documentation

## 2024-01-30 DIAGNOSIS — E782 Mixed hyperlipidemia: Secondary | ICD-10-CM

## 2024-01-30 DIAGNOSIS — Z720 Tobacco use: Secondary | ICD-10-CM

## 2024-01-30 DIAGNOSIS — E559 Vitamin D deficiency, unspecified: Secondary | ICD-10-CM

## 2024-01-30 DIAGNOSIS — R351 Nocturia: Secondary | ICD-10-CM | POA: Insufficient documentation

## 2024-01-30 DIAGNOSIS — F418 Other specified anxiety disorders: Secondary | ICD-10-CM

## 2024-01-30 MED ORDER — NICOTINE POLACRILEX 4 MG MT GUM: 4.0000 mg | CHEWING_GUM | OROMUCOSAL | 1 refills | Status: AC | PRN

## 2024-01-30 MED ORDER — ATENOLOL 50 MG PO TABS
50.0000 mg | ORAL_TABLET | Freq: Every day | ORAL | 1 refills | Status: AC
Start: 2024-01-30 — End: ?

## 2024-01-30 MED ORDER — TRAZODONE HCL 100 MG PO TABS: 100.0000 mg | ORAL_TABLET | Freq: Every day | ORAL | 1 refills | Status: DC

## 2024-01-30 MED ORDER — ATORVASTATIN CALCIUM 20 MG PO TABS: 20.0000 mg | ORAL_TABLET | Freq: Every day | ORAL | 1 refills | Status: DC

## 2024-01-30 MED ORDER — HYDROXYZINE PAMOATE 25 MG PO CAPS: 25.0000 mg | ORAL_CAPSULE | Freq: Three times a day (TID) | ORAL | 0 refills | Status: AC | PRN

## 2024-01-31 ENCOUNTER — Ambulatory Visit: Payer: Self-pay | Admitting: Nurse Practitioner

## 2024-01-31 LAB — LIPID PANEL
Chol/HDL Ratio: 5.1 ratio — ABNORMAL HIGH (ref 0.0–5.0)
Cholesterol, Total: 154 mg/dL (ref 100–199)
HDL: 30 mg/dL — ABNORMAL LOW (ref >39)
LDL Chol Calc (NIH): 79 mg/dL (ref 0–99)
Triglycerides: 273 mg/dL — ABNORMAL HIGH (ref 0–149)
VLDL Cholesterol Cal: 45 mg/dL — ABNORMAL HIGH (ref 5–40)

## 2024-01-31 LAB — CMP14+EGFR
ALT: 20 IU/L (ref 0–44)
AST: 24 IU/L (ref 0–40)
Albumin: 4.4 g/dL (ref 4.1–5.1)
Alkaline Phosphatase: 110 IU/L (ref 44–121)
BUN/Creatinine Ratio: 14 (ref 9–20)
BUN: 12 mg/dL (ref 6–24)
Bilirubin Total: <0.2 mg/dL (ref 0.0–1.2)
CO2: 24 mmol/L (ref 20–29)
Calcium: 9.6 mg/dL (ref 8.7–10.2)
Chloride: 101 mmol/L (ref 96–106)
Creatinine, Ser: 0.86 mg/dL (ref 0.76–1.27)
Globulin, Total: 2.6 g/dL (ref 1.5–4.5)
Glucose: 94 mg/dL (ref 70–99)
Potassium: 4.5 mmol/L (ref 3.5–5.2)
Sodium: 139 mmol/L (ref 134–144)
Total Protein: 7 g/dL (ref 6.0–8.5)
eGFR: 111 mL/min/1.73 (ref >59)

## 2024-01-31 LAB — CBC WITH DIFFERENTIAL/PLATELET
Basophils Absolute: 0.1 x10E3/uL (ref 0.0–0.2)
Basos: 1 %
EOS (ABSOLUTE): 0.2 x10E3/uL (ref 0.0–0.4)
Eos: 2 %
Hematocrit: 40.3 % (ref 37.5–51.0)
Hemoglobin: 12.7 g/dL — ABNORMAL LOW (ref 13.0–17.7)
Immature Grans (Abs): 0 x10E3/uL (ref 0.0–0.1)
Immature Granulocytes: 0 %
Lymphocytes Absolute: 5 x10E3/uL — ABNORMAL HIGH (ref 0.7–3.1)
Lymphs: 52 %
MCH: 27.3 pg (ref 26.6–33.0)
MCHC: 31.5 g/dL (ref 31.5–35.7)
MCV: 87 fL (ref 79–97)
Monocytes Absolute: 0.8 x10E3/uL (ref 0.1–0.9)
Monocytes: 8 %
Neutrophils Absolute: 3.6 x10E3/uL (ref 1.4–7.0)
Neutrophils: 37 %
Platelets: 314 x10E3/uL (ref 150–450)
RBC: 4.66 x10E6/uL (ref 4.14–5.80)
RDW: 14.8 % (ref 11.6–15.4)
WBC: 9.6 x10E3/uL (ref 3.4–10.8)

## 2024-01-31 LAB — VITAMIN D 25 HYDROXY (VIT D DEFICIENCY, FRACTURES): Vit D, 25-Hydroxy: 24.4 ng/mL — AB (ref 30.0–100.0)

## 2024-01-31 LAB — PSA, TOTAL AND FREE
PSA, Free Pct: 30
PSA, Free: 0.09 ng/mL
Prostate Specific Ag, Serum: 0.3 ng/mL (ref 0.0–4.0)

## 2024-01-31 LAB — THYROID PANEL WITH TSH
Free Thyroxine Index: 1.9 (ref 1.2–4.9)
T3 Uptake Ratio: 25 (ref 24–39)
T4, Total: 7.6 ug/dL (ref 4.5–12.0)
TSH: 1.72 u[IU]/mL (ref 0.450–4.500)

## 2024-01-31 MED ORDER — ATORVASTATIN CALCIUM 40 MG PO TABS: 40.0000 mg | ORAL_TABLET | Freq: Every day | ORAL | 1 refills | Status: DC

## 2024-02-13 ENCOUNTER — Telehealth: Payer: Self-pay | Admitting: Family Medicine

## 2024-02-13 NOTE — Telephone Encounter (Signed)
 Unable to update result encounter due to it being closed

## 2024-02-13 NOTE — Telephone Encounter (Signed)
 Copied from CRM [id number]. Topic: Clinical - Lab/Test Results >> Feb 06, 2024  4:41 PM Travis F wrote: Reason for CRM: Patient is calling in for his lab results. Results given to patient. Patient understands. >> Feb 12, 2024  5:05 PM Tobias L wrote: Patient called back, patient received results.

## 2024-05-20 ENCOUNTER — Other Ambulatory Visit: Payer: Self-pay | Admitting: Nurse Practitioner

## 2024-05-20 DIAGNOSIS — F5105 Insomnia due to other mental disorder: Secondary | ICD-10-CM

## 2024-06-26 ENCOUNTER — Encounter: Payer: Self-pay | Admitting: *Deleted

## 2024-07-29 ENCOUNTER — Ambulatory Visit: Payer: Self-pay | Admitting: Nurse Practitioner

## 2024-07-29 ENCOUNTER — Ambulatory Visit: Admitting: Family
